# Patient Record
Sex: Male | Born: 1990 | Race: White | Hispanic: No | Marital: Married | Smoking: Never smoker
Health system: Southern US, Community
[De-identification: ages and names within clinical notes are randomized; demographics above are authoritative.]

## PROBLEM LIST (undated history)

## (undated) DIAGNOSIS — C73 Malignant neoplasm of thyroid gland: Secondary | ICD-10-CM

## (undated) DIAGNOSIS — F329 Major depressive disorder, single episode, unspecified: Secondary | ICD-10-CM

## (undated) DIAGNOSIS — J302 Other seasonal allergic rhinitis: Secondary | ICD-10-CM

## (undated) DIAGNOSIS — K219 Gastro-esophageal reflux disease without esophagitis: Secondary | ICD-10-CM

## (undated) DIAGNOSIS — E079 Disorder of thyroid, unspecified: Secondary | ICD-10-CM

## (undated) DIAGNOSIS — F32A Depression, unspecified: Secondary | ICD-10-CM

## (undated) DIAGNOSIS — M1990 Unspecified osteoarthritis, unspecified site: Secondary | ICD-10-CM

## (undated) DIAGNOSIS — G4730 Sleep apnea, unspecified: Secondary | ICD-10-CM

## (undated) DIAGNOSIS — E669 Obesity, unspecified: Secondary | ICD-10-CM

## (undated) DIAGNOSIS — J45909 Unspecified asthma, uncomplicated: Secondary | ICD-10-CM

## (undated) HISTORY — DX: Gastro-esophageal reflux disease without esophagitis: K21.9

## (undated) HISTORY — DX: Obesity, unspecified: E66.9

## (undated) HISTORY — DX: Sleep apnea, unspecified: G47.30

## (undated) HISTORY — DX: Unspecified osteoarthritis, unspecified site: M19.90

## (undated) HISTORY — PX: THYROIDECTOMY: SHX17

## (undated) HISTORY — DX: Disorder of thyroid, unspecified: E07.9

## (undated) HISTORY — DX: Unspecified asthma, uncomplicated: J45.909

---
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
## 2011-06-02 ENCOUNTER — Emergency Department: Payer: Self-pay | Admitting: Emergency Medicine

## 2011-11-08 DIAGNOSIS — J4520 Mild intermittent asthma, uncomplicated: Secondary | ICD-10-CM | POA: Insufficient documentation

## 2012-06-30 DIAGNOSIS — E890 Postprocedural hypothyroidism: Secondary | ICD-10-CM | POA: Insufficient documentation

## 2012-06-30 DIAGNOSIS — C73 Malignant neoplasm of thyroid gland: Secondary | ICD-10-CM | POA: Insufficient documentation

## 2013-04-13 ENCOUNTER — Ambulatory Visit: Payer: Self-pay

## 2013-08-30 ENCOUNTER — Ambulatory Visit: Payer: Self-pay | Admitting: Physician Assistant

## 2013-08-30 LAB — RAPID STREP-A WITH REFLX: Micro Text Report: NEGATIVE

## 2013-09-01 LAB — BETA STREP CULTURE(ARMC)

## 2013-09-24 DIAGNOSIS — G4733 Obstructive sleep apnea (adult) (pediatric): Secondary | ICD-10-CM | POA: Insufficient documentation

## 2013-12-14 ENCOUNTER — Ambulatory Visit: Payer: Self-pay

## 2013-12-14 LAB — CBC WITH DIFFERENTIAL/PLATELET
BASOS PCT: 0.8 %
Basophil #: 0.2 10*3/uL — ABNORMAL HIGH (ref 0.0–0.1)
Eosinophil #: 0.5 10*3/uL (ref 0.0–0.7)
Eosinophil %: 3 %
HCT: 42.5 % (ref 40.0–52.0)
HGB: 13.6 g/dL (ref 13.0–18.0)
LYMPHS PCT: 18 %
Lymphocyte #: 3.2 10*3/uL (ref 1.0–3.6)
MCH: 26.9 pg (ref 26.0–34.0)
MCHC: 32 g/dL (ref 32.0–36.0)
MCV: 84 fL (ref 80–100)
MONO ABS: 1.1 x10 3/mm — AB (ref 0.2–1.0)
MONOS PCT: 6 %
NEUTROS ABS: 12.8 10*3/uL — AB (ref 1.4–6.5)
NEUTROS PCT: 72.2 %
Platelet: 293 10*3/uL (ref 150–440)
RBC: 5.05 10*6/uL (ref 4.40–5.90)
RDW: 14.3 % (ref 11.5–14.5)
WBC: 17.8 10*3/uL — ABNORMAL HIGH (ref 3.8–10.6)

## 2013-12-14 LAB — MONONUCLEOSIS SCREEN: MONO TEST: NEGATIVE

## 2013-12-14 LAB — RAPID STREP-A WITH REFLX: MICRO TEXT REPORT: NEGATIVE

## 2013-12-18 LAB — BETA STREP CULTURE(ARMC)

## 2014-01-14 DIAGNOSIS — E559 Vitamin D deficiency, unspecified: Secondary | ICD-10-CM | POA: Insufficient documentation

## 2014-03-18 ENCOUNTER — Ambulatory Visit: Payer: Self-pay | Admitting: Physician Assistant

## 2014-05-02 DIAGNOSIS — Z9884 Bariatric surgery status: Secondary | ICD-10-CM | POA: Insufficient documentation

## 2015-12-17 ENCOUNTER — Encounter: Payer: Self-pay | Admitting: Emergency Medicine

## 2015-12-17 ENCOUNTER — Ambulatory Visit
Admission: EM | Admit: 2015-12-17 | Discharge: 2015-12-17 | Disposition: A | Discharge status: Home / Self Care | Payer: BLUE CROSS/BLUE SHIELD | Attending: Family Medicine | Admitting: Family Medicine

## 2015-12-17 DIAGNOSIS — K529 Noninfective gastroenteritis and colitis, unspecified: Secondary | ICD-10-CM | POA: Diagnosis not present

## 2015-12-17 HISTORY — DX: Other seasonal allergic rhinitis: J30.2

## 2015-12-17 HISTORY — DX: Malignant neoplasm of thyroid gland: C73

## 2015-12-17 LAB — RAPID INFLUENZA A&B ANTIGENS (ARMC ONLY)
INFLUENZA A (ARMC): NEGATIVE
INFLUENZA B (ARMC): NEGATIVE

## 2015-12-17 MED ORDER — RANITIDINE HCL 150 MG PO CAPS: ORAL_CAPSULE | ORAL | Status: DC

## 2015-12-17 MED ORDER — BISMUTH SUBSALICYLATE 262 MG/15ML PO SUSP: 30.0000 mL | Freq: Three times a day (TID) | ORAL | Status: DC

## 2015-12-17 MED ORDER — ONDANSETRON 8 MG PO TBDP: 8.0000 mg | ORAL_TABLET | Freq: Three times a day (TID) | ORAL | Status: DC | PRN

## 2015-12-17 NOTE — ED Provider Notes (Signed)
CSN: [id number]     Arrival date & time 12/17/15  1428 History   First MD Initiated Contact with Patient 12/17/15 1508     Nurses notes were reviewed. Chief Complaint  Patient presents with  . Emesis  . Diarrhea  . Generalized Body Aches   Patient reports Wednesday to me nauseous sick on stomach and having diarrhea. He thought he took some Imodium on Friday the diarrhea stopped but he did have more nausea vomiting last night. Overall though the muscle aches and muscle pain has gotten better he feels better but he had the nausea and vomiting. States his sister got sick on Sunday they did eat [place] food together but he didn't a symptoms until Wednesday which would go against this being reports this is a also a completely different foods than the sister ate.    (Consider location/radiation/quality/duration/timing/severity/associated sxs/prior Treatment) Patient is a 25 y.o. male presenting with vomiting and diarrhea. The history is provided by the patient. No language interpreter was used.  Emesis Severity:  Moderate Able to tolerate:  Liquids and solids Progression:  Improving Chronicity:  New Recent urination:  Normal Associated symptoms: diarrhea and myalgias   Risk factors: no alcohol use, no diabetes, not pregnant now, no prior abdominal surgery, no sick contacts, no suspect food intake and no travel to endemic areas   Diarrhea Associated symptoms: myalgias and vomiting     Past Medical History  Diagnosis Date  . Seasonal allergies   . Thyroid cancer [facility])    Past Surgical History  Procedure Laterality Date  . Thyroidectomy     History reviewed. No pertinent family history. Social History  Substance Use Topics  . Smoking status: Never Smoker   . Smokeless tobacco: None  . Alcohol Use: No    Review of Systems  Gastrointestinal: Positive for nausea, vomiting and diarrhea.  Musculoskeletal: Positive for myalgias.  All other systems reviewed and are  negative.   Allergies  Review of patient's allergies indicates no known allergies.  Home Medications   Prior to Admission medications   Medication Sig Start Date End Date Taking? Authorizing Provider  levothyroxine (SYNTHROID, LEVOTHROID) 200 MCG tablet Take 200 mcg by mouth daily before breakfast.   Yes Historical Provider, MD  montelukast (SINGULAIR) 10 MG tablet Take 10 mg by mouth at bedtime.   Yes Historical Provider, MD  bismuth subsalicylate (PEPTO-BISMOL) 262 MG/15ML suspension Take 30 mLs by mouth 3 (three) times daily. Take with Zantac 3 times a day for 3-5 days may use tablet equivalency 12/17/15   Frederich Cha, MD  ondansetron (ZOFRAN ODT) 8 MG disintegrating tablet Take 1 tablet (8 mg total) by mouth every 8 (eight) hours as needed for nausea or vomiting. 12/17/15   Frederich Cha, MD  ranitidine (ZANTAC) 150 MG capsule 1 capsule 3 times a day with Pepto-Bismol dosage for 3-5 days 12/17/15   Frederich Cha, MD   Meds Ordered and Administered this Visit  Medications - No data to display  BP 96/60 mmHg  Pulse 79  Temp(Src) 98.8 F (37.1 C) (Oral)  Resp 17  Ht 5\' 8"  (1.727 m)  Wt 260 lb (117.935 kg)  BMI 39.54 kg/m2  SpO2 99% No data found.   Physical Exam  Constitutional: He is oriented to person, place, and time. He appears well-developed and well-nourished.  HENT:  Head: Normocephalic and atraumatic.  Eyes: Conjunctivae are normal. Pupils are equal, round, and reactive to light.  Neck: Normal range of motion. Neck supple. No tracheal deviation present.  Cardiovascular: Normal rate, regular rhythm and normal heart sounds.   Pulmonary/Chest: Breath sounds normal. No respiratory distress. He has no wheezes.  Abdominal: Soft. There is no splenomegaly or hepatomegaly. There is tenderness in the left lower quadrant. There is no CVA tenderness. No hernia. Hernia confirmed negative in the ventral area.    Musculoskeletal: Normal range of motion. He exhibits no tenderness.   Neurological: He is alert and oriented to person, place, and time.  Skin: Skin is warm and dry.  Psychiatric: He has a normal mood and affect.  Vitals reviewed.       ED Course  Procedures (including critical care time)  Labs Review Labs Reviewed  RAPID INFLUENZA A&B ANTIGENS ([place])    Imaging Review No results found.   Visual Acuity Review  Right Eye Distance:   Left Eye Distance:   Bilateral Distance:    Right Eye Near:   Left Eye Near:    Bilateral Near:     Results for orders placed or performed during the hospital encounter of 12/17/15  Rapid Influenza A&B Antigens ([facility] only)  Result Value Ref Range   Influenza A ([facility]) NEGATIVE NEGATIVE   Influenza B ([facility]) NEGATIVE NEGATIVE      MDM   1. Gastroenteritis, acute    Should be noted that respiratory some test was negative patient apparently had stomach virus having sister also being sick as well on different days. We'll place him on Zantac and Pepto-Bismol 3 times a day for the next 3-5 days. He can continue using Imodium which she is using over-the-counter to be careful since we don't want diarrhea or bowel movements to stop completely. Also recommend Zofran 8 mg sublingual when necessary basis for nausea and vomiting as needed. Will not return to work on Monday but will give a note that he was seen today as. He did miss work yesterday.  Note: This dictation was prepared with Dragon dictation along with smaller phrase technology. Any transcriptional errors that result from this process are unintentional.  Frederich Cha, MD 12/17/15 [phone]

## 2015-12-17 NOTE — ED Notes (Addendum)
Patient c/o diarrhea, vomiting, bodyaches, and headaches that started on Wed.  Patient states that he is feeling better.

## 2015-12-17 NOTE — Discharge Instructions (Signed)
Norovirus Infection °A norovirus infection is caused by exposure to a virus in a group of similar viruses (noroviruses). This type of infection causes inflammation in your stomach and intestines (gastroenteritis). Norovirus is the most common cause of gastroenteritis. It also causes food poisoning. °Anyone can get a norovirus infection. It spreads very easily (contagious). You can get it from contaminated food, water, surfaces, or other people. Norovirus is found in the stool or vomit of infected people. You can spread the infection as soon as you feel sick until 2 weeks after you recover.  °Symptoms usually begin within 2 days after you become infected. Most norovirus symptoms affect the digestive system. °CAUSES °Norovirus infection is caused by contact with norovirus. You can catch norovirus if you: °· Eat or drink something contaminated with norovirus. °· Touch surfaces or objects contaminated with norovirus and then put your hand in your mouth. °· Have direct contact with an infected person who has symptoms. °· Share food, drink, or utensils with someone with who is sick with norovirus. °SIGNS AND SYMPTOMS °Symptoms of norovirus may include: °· Nausea. °· Vomiting. °· Diarrhea. °· Stomach cramps. °· Fever. °· Chills. °· Headache. °· Muscle aches. °· Tiredness. °DIAGNOSIS °Your health care provider may suspect norovirus based on your symptoms and physical exam. Your health care provider may also test a sample of your stool or vomit for the virus.  °TREATMENT °There is no specific treatment for norovirus. Most people get better without treatment in about 2 days. °HOME CARE INSTRUCTIONS °· Replace lost fluids by drinking plenty of water or rehydration fluids containing important minerals called electrolytes. This prevents dehydration. Drink enough fluid to keep your urine clear or pale yellow. °· Do not prepare food for others while you are infected. Wait at least 3 days after recovering from the illness to do  that. °PREVENTION  °· Wash your hands often, especially after using the toilet or changing a diaper. °· Wash fruits and vegetables thoroughly before preparing or serving them. °· Throw out any food that a sick person may have touched. °· Disinfect contaminated surfaces immediately after someone in the household has been sick. Use a bleach-based household cleaner. °· Immediately remove and wash soiled clothes or sheets. °SEEK MEDICAL CARE IF: °· Your vomiting, diarrhea, and stomach pain is getting worse. °· Your symptoms of norovirus do not go away after 2-3 days. °SEEK IMMEDIATE MEDICAL CARE IF:  °You develop symptoms of dehydration that do not improve with fluid replacement. This may include: °· Excessive sleepiness. °· Lack of tears. °· Dry mouth. °· Dizziness when standing. °· Weak pulse. °  °This information is not intended to replace advice given to you by your health care provider. Make sure you discuss any questions you have with your health care provider. °  °Document Released: 11/24/2002 Document Revised: 09/24/2014 Document Reviewed: 02/11/2014 °Elsevier Interactive Patient Education ©2016 [employer] ° °

## 2017-03-30 ENCOUNTER — Encounter (HOSPITAL_COMMUNITY): Payer: Self-pay | Admitting: Emergency Medicine

## 2017-03-30 ENCOUNTER — Emergency Department (HOSPITAL_BASED_OUTPATIENT_CLINIC_OR_DEPARTMENT_OTHER)
Admission: EM | Admit: 2017-03-30 | Discharge: 2017-03-30 | Disposition: A | Discharge status: Home / Self Care | Payer: 59 | Source: Home / Self Care | Attending: Emergency Medicine | Admitting: Emergency Medicine

## 2017-03-30 ENCOUNTER — Emergency Department (HOSPITAL_COMMUNITY)
Admission: EM | Admit: 2017-03-30 | Discharge: 2017-03-30 | Disposition: A | Discharge status: Home / Self Care | Payer: 59 | Attending: Emergency Medicine | Admitting: Emergency Medicine

## 2017-03-30 DIAGNOSIS — M7989 Other specified soft tissue disorders: Secondary | ICD-10-CM | POA: Diagnosis not present

## 2017-03-30 DIAGNOSIS — Y9389 Activity, other specified: Secondary | ICD-10-CM | POA: Insufficient documentation

## 2017-03-30 DIAGNOSIS — Y9289 Other specified places as the place of occurrence of the external cause: Secondary | ICD-10-CM | POA: Diagnosis not present

## 2017-03-30 DIAGNOSIS — C73 Malignant neoplasm of thyroid gland: Secondary | ICD-10-CM | POA: Insufficient documentation

## 2017-03-30 DIAGNOSIS — W1789XA Other fall from one level to another, initial encounter: Secondary | ICD-10-CM | POA: Diagnosis not present

## 2017-03-30 DIAGNOSIS — Y999 Unspecified external cause status: Secondary | ICD-10-CM | POA: Diagnosis not present

## 2017-03-30 DIAGNOSIS — R200 Anesthesia of skin: Secondary | ICD-10-CM | POA: Insufficient documentation

## 2017-03-30 DIAGNOSIS — S79922A Unspecified injury of left thigh, initial encounter: Secondary | ICD-10-CM | POA: Diagnosis present

## 2017-03-30 DIAGNOSIS — S8012XA Contusion of left lower leg, initial encounter: Secondary | ICD-10-CM | POA: Diagnosis not present

## 2017-03-30 DIAGNOSIS — Z79899 Other long term (current) drug therapy: Secondary | ICD-10-CM | POA: Insufficient documentation
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
## 2017-03-30 NOTE — Discharge Instructions (Signed)
You do not have a blood clot in your left leg. Elevate leg, ice pack, Ace wrap, minimize activity.  Your leg may be swollen and discolored for a long period time.

## 2017-03-30 NOTE — Progress Notes (Signed)
**  Preliminary report by tech**  Left lower extremity venous duplex complete. There is no evidence of deep or superficial vein thrombosis involving the left lower extremity. All visualized vessels appear patent and compressible. There is no evidence of a Baker's cyst on the left. Results were given to Dr. Lacinda Axon.  03/30/17 11:27 AM Tony Pham RVT

## 2017-10-28 ENCOUNTER — Encounter: Payer: Self-pay | Admitting: Emergency Medicine

## 2017-10-28 ENCOUNTER — Other Ambulatory Visit: Payer: Self-pay

## 2017-10-28 ENCOUNTER — Emergency Department
Admission: EM | Admit: 2017-10-28 | Discharge: 2017-10-28 | Disposition: A | Discharge status: Home / Self Care | Payer: 59 | Source: Home / Self Care | Attending: Family Medicine | Admitting: Family Medicine

## 2017-10-28 DIAGNOSIS — R21 Rash and other nonspecific skin eruption: Secondary | ICD-10-CM | POA: Diagnosis not present

## 2017-10-28 MED ORDER — KETOCONAZOLE 2 % EX CREA: 1.0000 "application " | TOPICAL_CREAM | Freq: Every day | CUTANEOUS | 1 refills | Status: DC

## 2017-10-28 MED ORDER — TRIAMCINOLONE ACETONIDE 0.1 % EX CREA: 1.0000 "application " | TOPICAL_CREAM | Freq: Two times a day (BID) | CUTANEOUS | 1 refills | Status: DC

## 2017-10-28 NOTE — ED Provider Notes (Signed)
[facility]    CSN: [id number] Arrival date & time: 10/28/17  1537     History   Chief Complaint Chief Complaint  Patient presents with  . Rash    HPI Tony Pham is a 27 y.o. male.   Patient complains of a pruritic rash on his left arm for 1.5 weeks.  He wonders if it is a fungal infection, although it has not responded to clotrimazole cream   The history is provided by the patient.  Rash  Location: left arm. Quality: dryness, itchiness and redness   Quality: not blistering, not bruising, not burning, not draining, not painful, not peeling, not scaling, not swelling and not weeping   Severity:  Mild Onset quality:  Gradual Timing:  Constant Progression:  Worsening Chronicity:  New Context: not animal contact, not chemical exposure, not exposure to similar rash, not food, not hot tub use, not insect bite/sting, not medications, not new detergent/soap, not nuts and not plant contact   Relieved by:  Nothing Worsened by:  Nothing Ineffective treatments: clotrimazole cream. Associated symptoms: no diarrhea, no fatigue, no fever, no induration, no joint pain, no myalgias, no nausea and no sore throat     Past Medical History:  Diagnosis Date  . Seasonal allergies   . Thyroid cancer ([place])     There are no active problems to display for this patient.   Past Surgical History:  Procedure Laterality Date  . THYROIDECTOMY         Home Medications    Prior to Admission medications   Medication Sig Start Date End Date Taking? Authorizing Provider  fluticasone (FLONASE) 50 MCG/ACT nasal spray Place 1 spray into both nostrils daily.    [provider]  ketoconazole (NIZORAL) 2 % cream Apply 1 application topically daily. Use for two weeks. 10/28/17   Kandra Nicolas, MD  levothyroxine (SYNTHROID, LEVOTHROID) 112 MCG tablet Take 224 mcg by mouth daily before breakfast.     [provider]  loratadine (CLARITIN) 10 MG tablet Take 10 mg  by mouth daily.    [provider]  montelukast (SINGULAIR) 10 MG tablet Take 10 mg by mouth at bedtime.    [provider]  Multiple Vitamin (MULTIVITAMIN) capsule Take 1 capsule by mouth daily.    [provider]  triamcinolone cream (KENALOG) 0.1 % Apply 1 application topically 2 (two) times daily. 10/28/17   Kandra Nicolas, MD  Vitamin D, Ergocalciferol, (DRISDOL) 50000 units CAPS capsule Take 50,000 Units by mouth every 7 (seven) days.    [provider]    Family History No family history on file.  Social History Social History   Tobacco Use  . Smoking status: Never Smoker  . Smokeless tobacco: Never Used  Substance Use Topics  . Alcohol use: No  . Drug use: No     Allergies   Patient has no known allergies.   Review of Systems Review of Systems  Constitutional: Negative for fatigue and fever.  HENT: Negative for sore throat.   Gastrointestinal: Negative for diarrhea and nausea.  Musculoskeletal: Negative for arthralgias and myalgias.  Skin: Positive for rash.  All other systems reviewed and are negative.    Physical Exam Triage Vital Signs ED Triage Vitals [10/28/17 1618]  Enc Vitals Group     BP 102/67     Pulse Rate 67     Resp      Temp 98.3 F (36.8 C)     Temp Source Oral  SpO2 97 %     Weight (!) 303 lb (137.4 kg)     Height 5\' 8"  (1.727 m)     Head Circumference      Peak Flow      Pain Score      Pain Loc      Pain Edu?      Excl. in [place]?    No data found.  Updated Vital Signs BP 102/67 (BP Location: Right Arm)   Pulse 67   Temp 98.3 F (36.8 C) (Oral)   Ht 5\' 8"  (1.727 m)   Wt (!) 303 lb (137.4 kg)   SpO2 97%   BMI 46.07 kg/m   Visual Acuity Right Eye Distance:   Left Eye Distance:   Bilateral Distance:    Right Eye Near:   Left Eye Near:    Bilateral Near:     Physical Exam  Constitutional: He appears well-developed and well-nourished. No distress.  HENT:  Head: Normocephalic.    Right Ear: External ear normal.  Left Ear: External ear normal.  Nose: Nose normal.  Mouth/Throat: Oropharynx is clear and moist.  Eyes: Pupils are equal, round, and reactive to light.  Neck: Neck supple.  Cardiovascular: Normal heart sounds.  Pulmonary/Chest: Breath sounds normal.  Musculoskeletal: He exhibits no edema.       Right shoulder: He exhibits no tenderness and no swelling.       Arms: Left arm has scattered erythematous maculopapular lesions 5 to 72mm diameter.  Lymphadenopathy:    He has no cervical adenopathy.  Neurological: He is alert.  Skin: Skin is warm and dry.  Nursing note and vitals reviewed.    UC Treatments / Results  Labs (all labs ordered are listed, but only abnormal results are displayed) Labs Reviewed - No data to display  EKG  EKG Interpretation None       Radiology No results found.  Procedures Procedures (including critical care time)  Medications Ordered in UC Medications - No data to display   Initial Impression / Assessment and Plan / UC Course  I have reviewed the triage vital signs and the nursing notes.  Pertinent labs & imaging results that were available during my care of the patient were reviewed by me and considered in my medical decision making (see chart for details).    ?fungal etiology Begin empiric Nizoral 2% cream and triamcinolone 0.1% cream Followup with dermatologist if not improving two weeks.    Final Clinical Impressions(s) / UC Diagnoses   Final diagnoses:  Rash and nonspecific skin eruption    ED Discharge Orders        Ordered    ketoconazole (NIZORAL) 2 % cream  Daily     10/28/17 1649    triamcinolone cream (KENALOG) 0.1 %  2 times daily     10/28/17 1649           Kandra Nicolas, MD 11/04/17 1916

## 2017-10-28 NOTE — ED Triage Notes (Addendum)
Rash on left arm x 1.5 weeks. Has been using Lotrimin, but it is getting worse

## 2018-02-26 ENCOUNTER — Other Ambulatory Visit: Payer: Self-pay

## 2018-02-26 ENCOUNTER — Encounter: Payer: Self-pay | Admitting: Emergency Medicine

## 2018-02-26 ENCOUNTER — Emergency Department
Admission: EM | Admit: 2018-02-26 | Discharge: 2018-02-26 | Disposition: A | Discharge status: Home / Self Care | Payer: 59 | Source: Home / Self Care | Attending: Family Medicine | Admitting: Family Medicine

## 2018-02-26 DIAGNOSIS — H6505 Acute serous otitis media, recurrent, left ear: Secondary | ICD-10-CM

## 2018-02-26 DIAGNOSIS — B9789 Other viral agents as the cause of diseases classified elsewhere: Secondary | ICD-10-CM

## 2018-02-26 DIAGNOSIS — J029 Acute pharyngitis, unspecified: Secondary | ICD-10-CM

## 2018-02-26 DIAGNOSIS — J069 Acute upper respiratory infection, unspecified: Secondary | ICD-10-CM

## 2018-02-26 LAB — POCT RAPID STREP A (OFFICE): RAPID STREP A SCREEN: NEGATIVE

## 2018-02-26 MED ORDER — PREDNISONE 20 MG PO TABS: ORAL_TABLET | ORAL | 0 refills | Status: DC

## 2018-02-26 MED ORDER — AMOXICILLIN 875 MG PO TABS: 875.0000 mg | ORAL_TABLET | Freq: Two times a day (BID) | ORAL | 0 refills | Status: DC

## 2018-02-26 MED ORDER — BENZONATATE 200 MG PO CAPS: ORAL_CAPSULE | ORAL | 0 refills | Status: DC

## 2018-02-26 NOTE — ED Provider Notes (Signed)
[facility]    CSN: [id number] Arrival date & time: 02/26/18  0818     History   Chief Complaint Chief Complaint  Patient presents with  . Sore Throat  . Nasal Congestion    HPI Tony Pham is a 27 y.o. male.   Patient complains of four day history of typical cold-like symptoms developing over several days, including mild sore throat, sinus congestion, fatigue, chills, and cough.  His ears now feel full.  He has a past history of frequent otitis media as a child, and ventilation tubes.  He has a past history of pneumonia about 15 years ago.  He has seasonal rhinitis (springtime), and seasonally induced asthma.  The history is provided by the patient.    Past Medical History:  Diagnosis Date  . Seasonal allergies   . Thyroid cancer ([place])     There are no active problems to display for this patient.   Past Surgical History:  Procedure Laterality Date  . THYROIDECTOMY         Home Medications    Prior to Admission medications   Medication Sig Start Date End Date Taking? Authorizing Provider  amoxicillin (AMOXIL) 875 MG tablet Take 1 tablet (875 mg total) by mouth 2 (two) times daily. 02/26/18   Kandra Nicolas, MD  benzonatate (TESSALON) 200 MG capsule Take one cap by mouth at bedtime as needed for cough.  May repeat in 4 to 6 hours 02/26/18   Kandra Nicolas, MD  fluticasone [facility]) 50 MCG/ACT nasal spray Place 1 spray into both nostrils daily.    [provider]  ketoconazole (NIZORAL) 2 % cream Apply 1 application topically daily. Use for two weeks. 10/28/17   Kandra Nicolas, MD  levothyroxine (SYNTHROID, LEVOTHROID) 112 MCG tablet Take 224 mcg by mouth daily before breakfast.     [provider]  loratadine (CLARITIN) 10 MG tablet Take 10 mg by mouth daily.    [provider]  montelukast (SINGULAIR) 10 MG tablet Take 10 mg by mouth at bedtime.    [provider]  Multiple Vitamin (MULTIVITAMIN) capsule Take  1 capsule by mouth daily.    [provider]  predniSONE (DELTASONE) 20 MG tablet Take one tab by mouth twice daily for 5 days, then one daily for 3 days. Take with food. 02/26/18   Kandra Nicolas, MD  triamcinolone cream (KENALOG) 0.1 % Apply 1 application topically 2 (two) times daily. 10/28/17   Kandra Nicolas, MD  Vitamin D, Ergocalciferol, (DRISDOL) 50000 units CAPS capsule Take 50,000 Units by mouth every 7 (seven) days.    [provider]    Family History History reviewed. No pertinent family history.  Social History Social History   Tobacco Use  . Smoking status: Never Smoker  . Smokeless tobacco: Never Used  Substance Use Topics  . Alcohol use: No  . Drug use: No     Allergies   Patient has no known allergies.   Review of Systems Review of Systems + sore throat + cough No pleuritic pain No wheezing + nasal congestion + post-nasal drainage No sinus pain/pressure No itchy/red eyes ? earache No hemoptysis No SOB No fever, + chills No nausea No vomiting No abdominal pain No diarrhea No urinary symptoms No skin rash + fatigue No myalgias No headache Used OTC meds without relief  Physical Exam Triage Vital Signs ED Triage Vitals  Enc Vitals Group     BP 02/26/18 0836 130/80  Pulse Rate 02/26/18 0836 87     Resp --      Temp 02/26/18 0836 98.3 F (36.8 C)     Temp Source 02/26/18 0836 Oral     SpO2 02/26/18 0836 99 %     Weight 02/26/18 0837 (!) 303 lb (137.4 kg)     Height 02/26/18 0837 5\' 9"  (1.753 m)     Head Circumference --      Peak Flow --      Pain Score 02/26/18 0837 6     Pain Loc --      Pain Edu? --      Excl. in [place]? --    No data found.  Updated Vital Signs BP 130/80 (BP Location: Right Arm)   Pulse 87   Temp 98.3 F (36.8 C) (Oral)   Ht 5\' 9"  (1.753 m)   Wt (!) 303 lb (137.4 kg)   SpO2 99%   BMI 44.75 kg/m   Visual Acuity Right Eye Distance:   Left Eye Distance:   Bilateral Distance:     Right Eye Near:   Left Eye Near:    Bilateral Near:     Physical Exam Nursing notes and Vital Signs reviewed. Appearance:  Patient appears stated age, and in no acute distress Eyes:  Pupils are equal, round, and reactive to light and accomodation.  Extraocular movement is intact.  Conjunctivae are not inflamed  Ears:  Canals normal.  Right tympanic membrane scarred.  Left tympanic membrane scarred, slightly erythematous, and serous effusion present. Nose:  Congested turbinates.  No sinus tenderness.    Pharynx:   Mild erythema uvula. Neck:  Supple.  Enlarged posterior/lateral nodes are palpated bilaterally, tender to palpation on the left.   Lungs:  Clear to auscultation.  Breath sounds are equal.  Moving air well. Heart:  Regular rate and rhythm without murmurs, rubs, or gallops.  Abdomen:  Nontender without masses or hepatosplenomegaly.  Bowel sounds are present.  No CVA or flank tenderness.  Extremities:  No edema.  Skin:  No rash present.    UC Treatments / Results  Labs (all labs ordered are listed, but only abnormal results are displayed) Labs Reviewed  STREP A DNA PROBE  POCT RAPID STREP A (OFFICE) negative    EKG None  Radiology No results found.  Procedures Procedures (including critical care time)  Medications Ordered in UC Medications - No data to display  Initial Impression / Assessment and Plan / UC Course  I have reviewed the triage vital signs and the nursing notes.  Pertinent labs & imaging results that were available during my care of the patient were reviewed by me and considered in my medical decision making (see chart for details).    Begin amoxicillin, and prednisone burst/taper. Prescription written for Benzonatate [facility]) to take at bedtime for night-time cough. Followup with Family Doctor if not improved in about 10 days.  Final Clinical Impressions(s) / UC Diagnoses   Final diagnoses:  Acute pharyngitis, unspecified etiology  Viral  URI with cough  Recurrent acute serous otitis media of left ear     Discharge Instructions     Take plain guaifenesin (1200mg  extended release tabs such as Mucinex) twice daily, with plenty of water, for cough and congestion.  May add Pseudoephedrine (30mg , one or two every 4 to 6 hours) for sinus congestion.  Get adequate rest.   May use Afrin nasal spray (or generic oxymetazoline) each morning for about 5 days and then discontinue.  Also recommend using saline nasal spray several times daily and saline nasal irrigation (AYR is a common brand)  Try warm salt water gargles for sore throat.  Stop all antihistamines for now (such as Claritin) and other non-prescription cough/cold preparations. Continue Singulair.        ED Prescriptions    Medication Sig Dispense Auth. Provider   amoxicillin (AMOXIL) 875 MG tablet  (Status: Discontinued) Take 1 tablet (875 mg total) by mouth 2 (two) times daily. 20 tablet Kandra Nicolas, MD   predniSONE (DELTASONE) 20 MG tablet  (Status: Discontinued) Take one tab by mouth twice daily for 5 days, then one daily for 3 days. Take with food. 13 tablet Kandra Nicolas, MD   benzonatate (TESSALON) 200 MG capsule Take one cap by mouth at bedtime as needed for cough.  May repeat in 4 to 6 hours 15 capsule Kandra Nicolas, MD   predniSONE (DELTASONE) 20 MG tablet Take one tab by mouth twice daily for 5 days, then one daily for 3 days. Take with food. 13 tablet Kandra Nicolas, MD   amoxicillin (AMOXIL) 875 MG tablet Take 1 tablet (875 mg total) by mouth 2 (two) times daily. 20 tablet Kandra Nicolas, MD         Kandra Nicolas, MD 02/26/18 [phone]

## 2018-02-26 NOTE — Discharge Instructions (Addendum)
Take plain guaifenesin (1200mg  extended release tabs such as Mucinex) twice daily, with plenty of water, for cough and congestion.  May add Pseudoephedrine (30mg , one or two every 4 to 6 hours) for sinus congestion.  Get adequate rest.   May use Afrin nasal spray (or generic oxymetazoline) each morning for about 5 days and then discontinue.  Also recommend using saline nasal spray several times daily and saline nasal irrigation (AYR is a common brand)  Try warm salt water gargles for sore throat.  Stop all antihistamines for now (such as Claritin) and other non-prescription cough/cold preparations. Continue Singulair.

## 2018-02-26 NOTE — ED Triage Notes (Signed)
27 y.o male presents today with sore throat, right ear pain and congestion x4 days. He states he's been taking OTC meds with no relief.

## 2018-02-27 ENCOUNTER — Telehealth: Payer: Self-pay

## 2018-02-27 LAB — STREP A DNA PROBE: GROUP A STREP PROBE: NOT DETECTED

## 2018-02-27 NOTE — Telephone Encounter (Signed)
Spoke with patient, feeling better.  Lab results given.  Will follow up as needed.

## 2019-07-31 ENCOUNTER — Emergency Department (HOSPITAL_COMMUNITY): Payer: No Typology Code available for payment source

## 2019-07-31 ENCOUNTER — Encounter (HOSPITAL_COMMUNITY): Payer: Self-pay | Admitting: Emergency Medicine

## 2019-07-31 ENCOUNTER — Emergency Department (HOSPITAL_COMMUNITY)
Admission: EM | Admit: 2019-07-31 | Discharge: 2019-07-31 | Disposition: A | Discharge status: Home / Self Care | Payer: No Typology Code available for payment source | Attending: Emergency Medicine | Admitting: Emergency Medicine

## 2019-07-31 DIAGNOSIS — W19XXXA Unspecified fall, initial encounter: Secondary | ICD-10-CM | POA: Insufficient documentation

## 2019-07-31 DIAGNOSIS — S63617A Unspecified sprain of left little finger, initial encounter: Secondary | ICD-10-CM | POA: Diagnosis not present

## 2019-07-31 DIAGNOSIS — Y9389 Activity, other specified: Secondary | ICD-10-CM | POA: Diagnosis not present

## 2019-07-31 DIAGNOSIS — Y92149 Unspecified place in prison as the place of occurrence of the external cause: Secondary | ICD-10-CM | POA: Diagnosis not present

## 2019-07-31 DIAGNOSIS — S8391XA Sprain of unspecified site of right knee, initial encounter: Secondary | ICD-10-CM | POA: Insufficient documentation

## 2019-07-31 DIAGNOSIS — Y998 Other external cause status: Secondary | ICD-10-CM | POA: Diagnosis not present

## 2019-07-31 DIAGNOSIS — Z79899 Other long term (current) drug therapy: Secondary | ICD-10-CM | POA: Insufficient documentation

## 2019-07-31 DIAGNOSIS — S8991XA Unspecified injury of right lower leg, initial encounter: Secondary | ICD-10-CM | POA: Diagnosis present

## 2019-07-31 DIAGNOSIS — Z85850 Personal history of malignant neoplasm of thyroid: Secondary | ICD-10-CM | POA: Insufficient documentation

## 2019-07-31 MED ORDER — ACETAMINOPHEN 325 MG PO TABS
650.0000 mg | ORAL_TABLET | Freq: Once | ORAL | Status: AC
  Administered 2019-07-31: 650 mg via ORAL
  Filled 2019-07-31: qty 2

## 2019-07-31 MED ORDER — IBUPROFEN 800 MG PO TABS
800.0000 mg | ORAL_TABLET | Freq: Once | ORAL | Status: AC
  Administered 2019-07-31: 800 mg via ORAL
  Filled 2019-07-31: qty 1
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
## 2019-07-31 NOTE — Discharge Instructions (Signed)
Take Motrin and Tylenol as needed as directed. Use crutches, weight bear as tolerated. Apply ice to finger and knee for 30 minutes at a time and elevate, at least 3 times daily. Recheck with employee health/workers comp provider in 2 days.

## 2019-08-25 DIAGNOSIS — Z9884 Bariatric surgery status: Secondary | ICD-10-CM | POA: Insufficient documentation

## 2019-12-01 ENCOUNTER — Ambulatory Visit (INDEPENDENT_AMBULATORY_CARE_PROVIDER_SITE_OTHER): Payer: 59

## 2019-12-01 ENCOUNTER — Encounter (HOSPITAL_COMMUNITY): Payer: Self-pay

## 2019-12-01 ENCOUNTER — Other Ambulatory Visit: Payer: Self-pay

## 2019-12-01 ENCOUNTER — Ambulatory Visit (HOSPITAL_COMMUNITY)
Admission: EM | Admit: 2019-12-01 | Discharge: 2019-12-01 | Disposition: A | Discharge status: Home / Self Care | Payer: 59 | Attending: Emergency Medicine | Admitting: Emergency Medicine

## 2019-12-01 DIAGNOSIS — M79672 Pain in left foot: Secondary | ICD-10-CM

## 2019-12-01 HISTORY — DX: Depression, unspecified: F32.A

## 2019-12-01 MED ORDER — IBUPROFEN 600 MG PO TABS: 600.0000 mg | ORAL_TABLET | Freq: Four times a day (QID) | ORAL | 0 refills | Status: DC | PRN

## 2019-12-01 NOTE — ED Triage Notes (Signed)
Pt states he took the brace off his right knee for a torn ACL per dr orders last week and feels like his left foot has been compensating for the right leg and for the past 2 days has been having left foot pain. The pain is 10/10 pain on the outer foot that radiates to 4th and 5th metatarsal when walking on it. Pt has 2+ left pedal pulse, cap refill less than 3 sec, pt was able to walk on it to exam room. Foot warm to touch.

## 2019-12-01 NOTE — Discharge Instructions (Addendum)
Your x-ray was negative for fracture.  You may still have a stress fracture that did not show up.  You will need advanced imaging such as CT, MRI or ultrasound to confirm this.  Wear the cam walker and try staying off of your foot is much as possible by using crutches for the next several weeks.  600 mg of ibuprofen combined with the 1000 mg of Tylenol together 3-4 times a day as needed for pain.  Continue ice for 20 minutes at a time.

## 2019-12-01 NOTE — ED Provider Notes (Signed)
HPI  SUBJECTIVE:  Tony Pham is a 29 y.o. male who presents with left lateral dorsal foot pain for the past 2 days.  It is constant,  becomes sharp with walking.  States that his ankle is fine.  He states that he injured his right PCL/meniscus and has been compensating with his left lower extremity for the past 5 months.  Removed the brace last week per MDs orders in has been compensating more since then.  He denies trauma numbness tingling color changes bruising.  No recent change in shoes.  No erythema increased temperature.  He states it feels swollen.  He has tried potassium pills Tylenol aspirin rest and ice without improvement in symptoms. symptoms are worse with walking.  He has had symptoms like this before in his right foot which resolved with potassium pills.  He has a past medical history of thyroid cancer.  No history of gout osteoporosis peripheral arterial disease peripheral vascular disease diabetes peripheral neuropathy.  [id number], Rubbie Battiest, MD     Past Medical History:  Diagnosis Date  . Depression   . Seasonal allergies   . Thyroid cancer [facility])     Past Surgical History:  Procedure Laterality Date  . THYROIDECTOMY      History reviewed. No pertinent family history.  Social History   Tobacco Use  . Smoking status: Never Smoker  . Smokeless tobacco: Never Used  Substance Use Topics  . Alcohol use: No  . Drug use: No    No current facility-administered medications for this encounter.  Current Outpatient Medications:  .  albuterol (VENTOLIN HFA) 108 (90 Base) MCG/ACT inhaler, Inhale 2 puffs into the lungs every 6 (six) hours as needed for wheezing or shortness of breath., Disp: , Rfl:  .  buPROPion (WELLBUTRIN XL) 150 MG 24 hr tablet, Take 150 mg by mouth., Disp: , Rfl:  .  fluticasone (FLONASE) 50 MCG/ACT nasal spray, Place 1 spray into both nostrils daily., Disp: , Rfl:  .  ibuprofen (ADVIL) 600 MG tablet, Take 1 tablet (600 mg total) by mouth every 6  (six) hours as needed., Disp: 30 tablet, Rfl: 0 .  levothyroxine (SYNTHROID, LEVOTHROID) 112 MCG tablet, Take 224 mcg by mouth daily before breakfast. , Disp: , Rfl:  .  loratadine (CLARITIN) 10 MG tablet, Take 10 mg by mouth daily., Disp: , Rfl:  .  montelukast (SINGULAIR) 10 MG tablet, Take 10 mg by mouth at bedtime., Disp: , Rfl:  .  Multiple Vitamin (MULTIVITAMIN) capsule, Take 1 capsule by mouth daily., Disp: , Rfl:  .  Vitamin D, Ergocalciferol, (DRISDOL) 50000 units CAPS capsule, Take 2,000 Units by mouth every morning. , Disp: , Rfl:   No Known Allergies   ROS  As noted in HPI.   Physical Exam  BP 133/88 (BP Location: Right Arm)   Pulse 78   Temp 98.3 F (36.8 C) (Oral)   Resp 16   Ht 5\' 9"  (1.753 m)   Wt (!) 147.4 kg   SpO2 97%   BMI 47.99 kg/m   Constitutional: Well developed, well nourished, no acute distress Eyes:  EOMI, conjunctiva normal bilaterally HENT: Normocephalic, atraumatic,mucus membranes moist Respiratory: Normal inspiratory effort Cardiovascular: Normal rate GI: nondistended skin: No rash, skin intact Musculoskeletal Left midfoot NT. Base of fifth metatarsal tender. No bruising. Skin intact. DP 2+. Refill less than 2 seconds. Sensation grossly intact. Patient able to move all toes actively.   No pain with  dorsiflexion / plantarflexion. No Tenderness along the  plantar fascia. Distal fibula NT, Medial malleolus NT,  Deltoid ligament NT, Lateral ligaments NT, Achilles NT. Patient able to bear weight while in department. Neurologic: Alert & oriented x 3, no focal neuro deficits Psychiatric: Speech and behavior appropriate   ED Course   Medications - No data to display  Orders Placed This Encounter  Procedures  . DG Foot Complete Left    Standing Status:   Standing    Number of Occurrences:   1    Order Specific Question:   Reason for Exam (SYMPTOM  OR DIAGNOSIS REQUIRED)    Answer:   Fifth metatarsal tenderness rule out fracture.  Marland Kitchen Apply cam  walker    Standing Status:   Standing    Number of Occurrences:   1    Order Specific Question:   Laterality    Answer:   Left  . Maintain cam walker    Standing Status:   Standing    Number of Occurrences:   1    Order Specific Question:   Laterality    Answer:   Left    No results found for this or any previous visit (from the past 24 hour(s)). DG Foot Complete Left  Result Date: 12/01/2019 CLINICAL DATA:  Acute left foot pain without known injury. EXAM: LEFT FOOT - COMPLETE 3+ VIEW COMPARISON:  None. FINDINGS: There is no evidence of fracture or dislocation. There is no evidence of arthropathy or other focal bone abnormality. Soft tissues are unremarkable. IMPRESSION: Negative. Electronically Signed   By: Marijo Conception M.D.   On: 12/01/2019 09:06    ED Clinical Impression  1. Foot pain, left      ED Assessment/Plan  Checking foot x-ray to rule out stress fracture.  No evidence of neurovascular involvement.  Discussed with patient that a negative x-ray does not necessarily rule out stress fracture.  Discussed that he will need advanced imaging such as CT, MRI or possibly even ultrasound to definitively rule stress fracture out.  Placing in cam walker plus minus crutches.  Will send home with ibuprofen 600 mg combined with 1000 mg of Tylenol 3-4 times a day as needed for pain continue ice.  Follow-up with orthopedics or with [facility] if still symptomatic in a week for further imaging and management.  Reviewed imaging independently.  Normal foot.  See radiology report for full details.  Placing in cam walker.  Patient states he has crutches at home.  Plan as above  Discussed imaging, MDM, treatment plan, and plan for follow-up with patient. . patient agrees with plan.   Meds ordered this encounter  Medications  . ibuprofen (ADVIL) 600 MG tablet    Sig: Take 1 tablet (600 mg total) by mouth every 6 (six) hours as needed.    Dispense:  30 tablet    Refill:  0     *This clinic note was created using [employer]. Therefore, there may be occasional mistakes despite careful proofreading.   ?   Melynda Ripple, MD 12/01/19 [phone]

## 2020-01-07 ENCOUNTER — Other Ambulatory Visit (HOSPITAL_COMMUNITY): Payer: Self-pay | Admitting: Orthopaedic Surgery

## 2020-01-07 ENCOUNTER — Other Ambulatory Visit: Payer: Self-pay | Admitting: Orthopaedic Surgery

## 2020-01-07 DIAGNOSIS — M79672 Pain in left foot: Secondary | ICD-10-CM

## 2020-01-12 ENCOUNTER — Ambulatory Visit (HOSPITAL_COMMUNITY)
Admission: RE | Admit: 2020-01-12 | Discharge: 2020-01-12 | Disposition: A | Discharge status: Home / Self Care | Payer: 59 | Source: Ambulatory Visit | Attending: Orthopaedic Surgery | Admitting: Orthopaedic Surgery

## 2020-01-12 ENCOUNTER — Other Ambulatory Visit: Payer: Self-pay

## 2020-01-12 DIAGNOSIS — M79672 Pain in left foot: Secondary | ICD-10-CM | POA: Diagnosis not present

## 2020-03-12 ENCOUNTER — Other Ambulatory Visit: Payer: Self-pay

## 2020-03-12 ENCOUNTER — Telehealth (INDEPENDENT_AMBULATORY_CARE_PROVIDER_SITE_OTHER): Payer: 59 | Admitting: Psychiatry

## 2020-03-12 ENCOUNTER — Encounter (HOSPITAL_COMMUNITY): Payer: Self-pay | Admitting: Psychiatry

## 2020-03-12 ENCOUNTER — Telehealth (HOSPITAL_COMMUNITY): Payer: Self-pay | Admitting: Psychiatry

## 2020-03-12 VITALS — Wt 350.0 lb

## 2020-03-12 DIAGNOSIS — F330 Major depressive disorder, recurrent, mild: Secondary | ICD-10-CM

## 2020-03-12 DIAGNOSIS — F902 Attention-deficit hyperactivity disorder, combined type: Secondary | ICD-10-CM

## 2020-03-12 DIAGNOSIS — R632 Polyphagia: Secondary | ICD-10-CM | POA: Diagnosis not present

## 2020-03-12 MED ORDER — BUPROPION HCL ER (XL) 300 MG PO TB24: 300.0000 mg | ORAL_TABLET | Freq: Every day | ORAL | 1 refills | Status: DC

## 2020-03-12 MED ORDER — TRAZODONE HCL 50 MG PO TABS: 50.0000 mg | ORAL_TABLET | Freq: Every evening | ORAL | 0 refills | Status: DC | PRN

## 2020-03-12 NOTE — Progress Notes (Signed)
Virtual Visit via Video Note  I connected with Laural Pham on 03/12/20 at  9:00 AM EDT by a video enabled telemedicine application and verified that I am speaking with the correct person using two identifiers.  Location: Patient: Home Provider: Home Office   I discussed the limitations of evaluation and management by telemedicine and the availability of in person appointments. The patient expressed understanding and agreed to proceed.   [facility] Initial Assessment Note  Tony Pham [id number] 29 y.o.  03/12/2020 9:02 AM  Chief Complaint:  I have no motivation.  I am depressed.  History of Present Illness:  Tony Pham is a 29 year old Caucasian, married, employed man who is referred from his PCP Dr. Iona Pham for the management of depression.  Patient reported for past 9 months he has been struggling with depression.  He has no motivation, and energy and feels sad.  He had a history of ADHD and prescribed Ritalin from age 29 to age 29 but then he feel he grew up and does not need it.  He still struggles some time with attention and focus but lately he is more depressed.  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  He admitted lately more irritable, frustrated having poor sleep and racing thoughts.  He admitted losing interest in daily activities.  He worries about finances and his health.  He admitted that his job is very stressful.  Last November he was injured at work when he had to intervene a conflict between the client at work.  He injured his knee and torn his meniscus.  He was out of work for 2 months and now back to the light duty.  He is a still doing physical therapy.  Patient admitted since then he feels more sad because he cannot do workout and he had gained excessive weight.  He is with nutritionist and trying to lose weight.  His PCP recently started Wellbutrin XL 150 mg which he took for 2 months but then he stopped after he felt it did not work.  However he restarted  again when his wife told that he is been more irritable and angry and now is back on 150 mg.  He admitted insomnia, thinking too much about himself.  He denies any hallucination, paranoia, violence, OCD, PTSD or any panic attacks.  He also endorsed binge eating and sometimes impulsive that could be one of the reason he had gained weight.  He lives with his wife who he has been married for 2 years.  He has no children.  His grandfather died 2 months ago due to [place].  His parents lives close by.  He had a good support from his family.  He has a history of gastric sleeve, thyroid cancer, mild asthma and taking medication from Dr. Iona Pham.  He is not in therapy.  He is open to adjust his medication.  Past Psychiatric History: History of ADHD and eating disorder.  Remember taking special classes because of lack of focus attention and hyperactivity.  Took Adderall and then Ritalin by psychiatrist from age 59 to age 51.  No history of suicidal attempt, inpatient, mania, psychosis, PTSD, OCD or drug use.  His PCP Dr. Iona Pham started Wellbutrin XL 4 months ago.  Family History: Sister has schizophrenia and another sister has depression.  Past Medical History:  Diagnosis Date  . Depression   . Seasonal allergies   . Thyroid cancer ([place])      Traumatic brain injury: No history  of traumatic brain injury.  Work History; Patient finished high school.  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  Psychosocial History; Patient born and raised in [place].  He has 2 sister.  His parent lives close by.  He is married for past 2 years and his wife is very supportive.  Patient has no children.  Legal History; Patient denies any legal issues.  History Of Abuse; Denies any history of abuse.  Substance Abuse History; Denies any history of substance abuse history.  Neurologic: Headache: No Seizure: No Paresthesias: No   Outpatient  Encounter Medications as of 03/12/2020  Medication Sig  . albuterol (VENTOLIN HFA) 108 (90 Base) MCG/ACT inhaler Inhale 2 puffs into the lungs every 6 (six) hours as needed for wheezing or shortness of breath.  Marland Kitchen buPROPion (WELLBUTRIN XL) 150 MG 24 hr tablet Take 150 mg by mouth.  . fluticasone (FLONASE) 50 MCG/ACT nasal spray Place 1 spray into both nostrils daily.  Marland Kitchen ibuprofen (ADVIL) 600 MG tablet Take 1 tablet (600 mg total) by mouth every 6 (six) hours as needed.  Marland Kitchen levothyroxine (SYNTHROID, LEVOTHROID) 112 MCG tablet Take 224 mcg by mouth daily before breakfast.   . loratadine (CLARITIN) 10 MG tablet Take 10 mg by mouth daily.  . montelukast (SINGULAIR) 10 MG tablet Take 10 mg by mouth at bedtime.  . Multiple Vitamin (MULTIVITAMIN) capsule Take 1 capsule by mouth daily.  . Vitamin D, Ergocalciferol, (DRISDOL) 50000 units CAPS capsule Take 2,000 Units by mouth every morning.    No facility-administered encounter medications on file as of 03/12/2020.    No results found for this or any previous visit (from the past 2160 hour(s)).    Constitutional:  There were no vitals taken for this visit.   Musculoskeletal: Strength & Muscle Tone: within normal limits Gait & Station: normal Patient leans: N/A  Psychiatric Specialty Exam: Physical Exam  ROS  Weight (!) 350 lb (158.8 kg).There is no height or weight on file to calculate BMI.  General Appearance: Casual and over weight  Eye Contact:  Good  Speech:  Normal Rate  Volume:  Normal  Mood:  Anxious, Depressed and Dysphoric  Affect:  Constricted  Thought Process:  Goal Directed  Orientation:  Full (Time, Place, and Person)  Thought Content:  Rumination  Suicidal Thoughts:  No  Homicidal Thoughts:  No  Memory:  Immediate;   Good Recent;   Good Remote;   Good  Judgement:  Intact  Insight:  Present  Psychomotor Activity:  Normal  Concentration:  Concentration: Fair and Attention Span: Fair  Recall:  Good  Fund of Knowledge:   Good  Language:  Good  Akathisia:  No  Handed:  Right  AIMS (if indicated):     Assets:  Communication Skills Desire for Improvement Housing Resilience Social Support Transportation  ADL's:  Intact  Cognition:  WNL  Sleep:   poor     Assessment/Plan:  Vada is 29 year old with history of ADHD, eating disorder presented with symptoms of depression.  His PCP Dr. Iona Pham prescribed Wellbutrin XL 150 mg but he has not seen much improvement.  He tried to stop the medication but wife noticed that he is more irritable.  I recommend to increase the dose to 300 to see if that helps his depression.  He also concerned about weight gain.  I recommend if increased Wellbutrin did not help him we may consider Trintellix  or Vyvanse.  I also suggest that he should consider his insurance if these medicines covered from insurance.  I would also add low-dose trazodone to help sleep.  I offered therapy but patient at this time does not feel he needed.  I discussed medication side effects and benefits.  Encouraged to continue weight loss program with dietitian.  I provided nurse triage line in case he has any question concern or issues with the medication.  Recommended to call us back if symptoms worsening.  Follow-up in 4 to 6 weeks.  Kathlee Nations, MD 03/12/2020             Follow Up Instructions:    I discussed the assessment and treatment plan with the patient. The patient was provided an opportunity to ask questions and all were answered. The patient agreed with the plan and demonstrated an understanding of the instructions.   The patient was advised to call back or seek an in-person evaluation if the symptoms worsen or if the condition fails to improve as anticipated.  I provided 55 minutes of non-face-to-face time during this encounter.   Kathlee Nations, MD

## 2020-07-18 DIAGNOSIS — M25561 Pain in right knee: Secondary | ICD-10-CM | POA: Insufficient documentation

## 2020-07-18 DIAGNOSIS — S83521A Sprain of posterior cruciate ligament of right knee, initial encounter: Secondary | ICD-10-CM | POA: Insufficient documentation

## 2021-02-27 ENCOUNTER — Other Ambulatory Visit: Payer: Self-pay

## 2021-02-27 ENCOUNTER — Ambulatory Visit
Admission: EM | Admit: 2021-02-27 | Discharge: 2021-02-27 | Disposition: A | Discharge status: Home / Self Care | Payer: 59 | Attending: Emergency Medicine | Admitting: Emergency Medicine

## 2021-02-27 ENCOUNTER — Encounter: Payer: Self-pay | Admitting: Emergency Medicine

## 2021-02-27 DIAGNOSIS — H6693 Otitis media, unspecified, bilateral: Secondary | ICD-10-CM | POA: Insufficient documentation

## 2021-02-27 DIAGNOSIS — J029 Acute pharyngitis, unspecified: Secondary | ICD-10-CM | POA: Insufficient documentation

## 2021-02-27 LAB — POCT RAPID STREP A (OFFICE): Rapid Strep A Screen: NEGATIVE

## 2021-02-27 MED ORDER — AMOXICILLIN 875 MG PO TABS: 875.0000 mg | ORAL_TABLET | Freq: Two times a day (BID) | ORAL | 0 refills | Status: AC

## 2021-02-27 NOTE — ED Provider Notes (Signed)
Tony Pham    CSN: [id number] Arrival date & time: 02/27/21  2122      History   Chief Complaint Chief Complaint  Patient presents with   Sore Throat   Otalgia    HPI Tony Pham is a 30 y.o. male.  Patient presents with 3-day history of right earache and sore throat.  Treatment at home with sore throat spray and lozenges.  He denies fever, rash, cough, shortness of breath, vomiting, diarrhea, or other symptoms.  He had a negative COVID test at home.  His medical history includes allergies, asthma, OSA, hypothyroidism, thyroid carcinoma.  The history is provided by the patient and medical records.   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 Authorizing Provider  amoxicillin (AMOXIL) 875 MG tablet Take 1 tablet (875 mg total) by mouth 2 (two) times daily for 7 days. 02/27/21 03/06/21 Yes Sharion Balloon, NP  fluticasone (FLONASE) 50 MCG/ACT nasal spray Place 1 spray into both nostrils daily.   Yes [provider]  levothyroxine (SYNTHROID) 112 MCG tablet Take 112 mcg by mouth daily before breakfast.   Yes [provider]  levothyroxine (SYNTHROID) 125 MCG tablet Take 125 mcg by mouth daily before breakfast.   Yes [provider]  loratadine (CLARITIN) 10 MG tablet Take 10 mg by mouth daily.   Yes [provider]  montelukast (SINGULAIR) 10 MG tablet Take 10 mg by mouth at  bedtime.   Yes [provider]  Multiple Vitamin (MULTIVITAMIN) capsule Take 1 capsule by mouth daily.   Yes [provider]  Vitamin D, Ergocalciferol, (DRISDOL) 50000 units CAPS capsule Take 2,000 Units by mouth every morning.    Yes [provider]  albuterol (VENTOLIN HFA) 108 (90 Base) MCG/ACT inhaler Inhale 2 puffs into the lungs every 6 (six) hours as needed for wheezing or shortness of breath.    [provider]  buPROPion (WELLBUTRIN XL) 300 MG 24 hr tablet Take 1 tablet (300 mg total) by mouth daily. 03/12/20 03/12/21  Arfeen, Arlyce Harman, MD  ibuprofen (ADVIL) 600 MG tablet Take 1 tablet (600 mg total) by mouth every 6 (six) hours as needed. 12/01/19   Melynda Ripple, MD  traZODone (DESYREL) 50 MG tablet Take 1 tablet (50 mg total) by mouth at bedtime as needed for sleep. 03/12/20   Arfeen, Arlyce Harman, MD    Family History Family History  Problem Relation Age of Onset   Schizophrenia Sister    Depression Sister     Social History Social History   Tobacco Use   Smoking status: Never   Smokeless tobacco: Never  Vaping Use   Vaping Use: Never used  Substance Use Topics   Alcohol use: No   Drug use: No     Allergies   Patient has no known allergies.   Review of Systems Review of Systems  Constitutional:  Negative for chills and fever.  HENT:  Positive for ear pain and sore throat.   Respiratory:  Negative for cough and shortness of breath.   Cardiovascular:  Negative for chest pain and palpitations.  Gastrointestinal:  Negative for abdominal pain, diarrhea and vomiting.  Skin:  Negative for color change and rash.  All other systems reviewed and are negative.   Physical Exam Triage Vital Signs ED Triage Vitals  Enc Vitals Group     BP      Pulse      Resp      Temp      Temp src      SpO2      Weight      Height      Head Circumference      Peak Flow      Pain Score      Pain Loc      Pain Edu?      Excl. in [place]?    No  data found.  Updated Vital Signs BP 109/68 (BP Location: Left Arm)   Pulse 74   Temp 98.3 F (36.8 C) (Oral)   Resp 18   SpO2 96%   Visual Acuity Right Eye Distance:   Left Eye Distance:   Bilateral Distance:    Right Eye Near:   Left Eye Near:    Bilateral Near:     Physical Exam Vitals and nursing note reviewed.  Constitutional:      General: He is not in acute distress.    Appearance: He is well-developed. He is not ill-appearing.  HENT:     Head: Normocephalic and atraumatic.     Right Ear: Tympanic membrane is erythematous.     Left Ear: Tympanic membrane is erythematous.     Nose: Nose normal.     Mouth/Throat:     Mouth: Mucous membranes are moist.     Pharynx: Posterior oropharyngeal erythema present.  Eyes:     Conjunctiva/sclera: Conjunctivae normal.  Cardiovascular:     Rate and Rhythm: Normal rate and regular rhythm.     Heart sounds: Normal heart sounds.  Pulmonary:     Effort: Pulmonary effort is normal. No respiratory distress.     Breath sounds: Normal breath sounds.  Abdominal:     Palpations: Abdomen is soft.     Tenderness: There is no abdominal tenderness.  Musculoskeletal:     Cervical back: Neck supple.  Skin:    General: Skin is warm and dry.  Neurological:     General: No focal deficit present.     Mental Status: He is alert and oriented to person, place, and time.     Gait: Gait normal.  Psychiatric:        Mood and Affect: Mood normal.        Behavior: Behavior normal.     UC Treatments / Results  Labs (all labs ordered are listed, but only abnormal results are displayed) Labs Reviewed  CULTURE, GROUP A STREP [facility])  POCT RAPID STREP A (OFFICE)    EKG   Radiology No results found.  Procedures Procedures (including critical care time)  Medications Ordered in UC Medications - No data to display  Initial Impression / Assessment and Plan / UC Course  I have reviewed the triage vital signs and the nursing  notes.  Pertinent labs & imaging results that were available during my care of the patient were reviewed by me and considered in my medical decision making (see  chart for details).   Bilateral otitis media, sore throat.  Treating with amoxicillin.  Throat culture pending.  Patient declines COVID test today.  Discussed symptomatic treatment with Tylenol.  Instructed him to follow-up with his PCP if his symptoms are not improving.  Patient agrees to plan of care.   Final Clinical Impressions(s) / UC Diagnoses   Final diagnoses:  Bilateral otitis media, unspecified otitis media type  Sore throat     Discharge Instructions      Take the amoxicillin as directed.    Follow up with your primary care provider if your symptoms are not improving.         ED Prescriptions     Medication Sig Dispense Auth. Provider   amoxicillin (AMOXIL) 875 MG tablet Take 1 tablet (875 mg total) by mouth 2 (two) times daily for 7 days. 14 tablet Sharion Balloon, NP      PDMP not reviewed this encounter.   Sharion Balloon, NP 02/27/21 [phone]

## 2021-02-27 NOTE — Discharge Instructions (Addendum)
Take the amoxicillin as directed.  Follow up with your primary care provider if your symptoms are not improving.   ° ° °

## 2021-02-27 NOTE — ED Triage Notes (Addendum)
Patient c/o sore throat and RT sided ear ache x 3 days.   Patient denies fever at home.  Patient denies sinus pressure, nasal congestion, hearing changes and headache.   Patient states " any time I try to swallow it hurts so bad".   Patient performed a at home COVID-19 test with negative test results per patient statement.   Patient has used sore throat spray and lozenges with no relief of symptoms.

## 2021-03-02 LAB — CULTURE, GROUP A STREP (THRC)

## 2021-03-21 ENCOUNTER — Encounter: Payer: Self-pay | Admitting: Podiatry

## 2021-03-21 ENCOUNTER — Other Ambulatory Visit: Payer: Self-pay

## 2021-03-21 ENCOUNTER — Ambulatory Visit (INDEPENDENT_AMBULATORY_CARE_PROVIDER_SITE_OTHER): Payer: 59

## 2021-03-21 ENCOUNTER — Ambulatory Visit: Payer: 59 | Admitting: Podiatry

## 2021-03-21 DIAGNOSIS — M722 Plantar fascial fibromatosis: Secondary | ICD-10-CM

## 2021-03-21 MED ORDER — METHYLPREDNISOLONE 4 MG PO TBPK: ORAL_TABLET | ORAL | 0 refills | Status: DC

## 2021-03-21 MED ORDER — BETAMETHASONE SOD PHOS & ACET 6 (3-3) MG/ML IJ SUSP: 3.0000 mg | Freq: Once | INTRAMUSCULAR | Status: DC

## 2021-03-21 NOTE — Progress Notes (Signed)
   Subjective: 30 y.o. male presenting to the office today for left heel pain is been going on for about 4 months now.  He denies history of injury.  He cannot recollect any incident that would have initiated his pain.  It is painful when getting out of bed in the mornings.  He did go to his PCP who recommended heel cups.  This did not do anything to help alleviate his pain.  He presents for further treatment and evaluation  Past Medical History:  Diagnosis Date   Depression    Seasonal allergies    Thyroid cancer ([place])      Objective: Physical Exam General: The patient is alert and oriented x3 in no acute distress.  Dermatology: Skin is warm, dry and supple bilateral lower extremities. Negative for open lesions or macerations bilateral.   Vascular: Dorsalis Pedis and Posterior Tibial pulses palpable bilateral.  Capillary fill time is immediate to all digits.  Neurological: Epicritic and protective threshold intact bilateral.   Musculoskeletal: Tenderness to palpation to the plantar aspect of the left heel along the plantar fascia. All other joints range of motion within normal limits bilateral. Strength 5/5 in all groups bilateral.   Radiographic exam: Normal osseous mineralization. Joint spaces preserved. No fracture/dislocation/boney destruction. No other soft tissue abnormalities or radiopaque foreign bodies.   Assessment: 1. Plantar fasciitis left foot  Plan of Care:  1. Patient evaluated. Xrays reviewed.   2. Injection of 0.5cc Celestone soluspan injected into the left plantar fascia.  3. Rx for Medrol Dose Pak placed 4.  Patient has a prescription for meloxicam 15 mg at home.  Take daily after completion of the Dosepak 5. Plantar fascial band(s) dispensed  6. Instructed patient regarding therapies and modalities at home to alleviate symptoms.  7. Return to clinic in 4 weeks.    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 Edrick Kins, DPM    [address], [place] [address]                Office [phone]  Fax [phone]

## 2021-04-18 ENCOUNTER — Ambulatory Visit (INDEPENDENT_AMBULATORY_CARE_PROVIDER_SITE_OTHER): Payer: Commercial Managed Care - PPO | Admitting: Podiatry

## 2021-04-18 ENCOUNTER — Other Ambulatory Visit: Payer: Self-pay

## 2021-04-18 DIAGNOSIS — M722 Plantar fascial fibromatosis: Secondary | ICD-10-CM

## 2021-04-18 MED ORDER — BETAMETHASONE SOD PHOS & ACET 6 (3-3) MG/ML IJ SUSP: 3.0000 mg | Freq: Once | INTRAMUSCULAR | Status: DC

## 2021-04-18 NOTE — Progress Notes (Signed)
   Subjective: 30 y.o. male presenting to the office today for follow-up evaluation of plantar fasciitis to the left foot this been going on for about 5 months now.  Patient states there is only some minimal improvement.  He did feel better for about 1 week after the visit however the pain quickly recurred.  He presents for further treatment and evaluation.  He has been taking the meloxicam daily and wearing the plantar fascial brace as instructed  Past Medical History:  Diagnosis Date   Depression    Seasonal allergies    Thyroid cancer ([place])      Objective: Physical Exam General: The patient is alert and oriented x3 in no acute distress.  Dermatology: Skin is warm, dry and supple bilateral lower extremities. Negative for open lesions or macerations bilateral.   Vascular: Dorsalis Pedis and Posterior Tibial pulses palpable bilateral.  Capillary fill time is immediate to all digits.  Neurological: Epicritic and protective threshold intact bilateral.   Musculoskeletal: Tenderness to palpation to the plantar aspect of the left heel along the plantar fascia. All other joints range of motion within normal limits bilateral. Strength 5/5 in all groups bilateral.    Assessment: 1. Plantar fasciitis left foot  Plan of Care:  1. Patient evaluated. Xrays reviewed.   2. Injection of 0.5cc Celestone soluspan injected into the left plantar fascia.  3.  Continue meloxicam 15 mg daily 4.  Continue plantar fascial brace. 5.  OTC power step insoles were provided for the patient to wear in his work boots daily 6.  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 Edrick Kins, DPM    [address], [place] [address]                Office [phone]  Fax [phone]

## 2021-05-19 ENCOUNTER — Other Ambulatory Visit: Payer: Self-pay

## 2021-05-19 ENCOUNTER — Ambulatory Visit: Payer: Commercial Managed Care - PPO | Admitting: Podiatry

## 2021-05-19 DIAGNOSIS — M722 Plantar fascial fibromatosis: Secondary | ICD-10-CM | POA: Diagnosis not present

## 2021-06-04 MED ORDER — BETAMETHASONE SOD PHOS & ACET 6 (3-3) MG/ML IJ SUSP: 3.0000 mg | Freq: Once | INTRAMUSCULAR | Status: DC

## 2021-06-04 NOTE — Progress Notes (Signed)
   Subjective: 30 y.o. male presenting to the office today for follow-up evaluation of plantar fasciitis to the left foot.  Patient states that overall he is doing much better.  He says the injections have slowly helped.  Also the power step insoles helped however he states that the feels that they are not providing enough support.  Overall improvement however.  No new complaints at this time  Past Medical History:  Diagnosis Date   Depression    Seasonal allergies    Thyroid cancer ([place])      Objective: Physical Exam General: The patient is alert and oriented x3 in no acute distress.  Dermatology: Skin is warm, dry and supple bilateral lower extremities. Negative for open lesions or macerations bilateral.   Vascular: Dorsalis Pedis and Posterior Tibial pulses palpable bilateral.  Capillary fill time is immediate to all digits.  Neurological: Epicritic and protective threshold intact bilateral.   Musculoskeletal: Tenderness to palpation to the plantar aspect of the left heel along the plantar fascia. All other joints range of motion within normal limits bilateral. Strength 5/5 in all groups bilateral.    Assessment: 1. Plantar fasciitis left foot  Plan of Care:  1. Patient evaluated. 2. Injection of 0.5cc Celestone soluspan injected into the left plantar fascia.  3.  Continue meloxicam 15 mg daily 4.  Continue OTC power step insoles that were provided.  Patient states that they helped significantly 5.  Today the patient was molded for custom molded orthotics 6.  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 Edrick Kins, DPM    [address] [place], [place] [address]                Office [phone]  Fax [phone]

## 2021-06-13 ENCOUNTER — Other Ambulatory Visit: Payer: Self-pay | Admitting: Family Medicine

## 2021-06-13 DIAGNOSIS — R229 Localized swelling, mass and lump, unspecified: Secondary | ICD-10-CM

## 2021-06-23 ENCOUNTER — Other Ambulatory Visit: Payer: Self-pay

## 2021-06-23 ENCOUNTER — Ambulatory Visit
Admission: RE | Admit: 2021-06-23 | Discharge: 2021-06-23 | Disposition: A | Discharge status: Home / Self Care | Payer: Commercial Managed Care - PPO | Source: Ambulatory Visit | Attending: Family Medicine | Admitting: Family Medicine

## 2021-06-23 DIAGNOSIS — R229 Localized swelling, mass and lump, unspecified: Secondary | ICD-10-CM | POA: Insufficient documentation

## 2021-06-29 ENCOUNTER — Other Ambulatory Visit: Payer: Self-pay | Admitting: Physician Assistant

## 2021-06-29 DIAGNOSIS — R229 Localized swelling, mass and lump, unspecified: Secondary | ICD-10-CM

## 2021-07-03 ENCOUNTER — Other Ambulatory Visit: Payer: Self-pay | Admitting: Physician Assistant

## 2021-07-03 ENCOUNTER — Ambulatory Visit
Admission: RE | Admit: 2021-07-03 | Discharge: 2021-07-03 | Disposition: A | Discharge status: Home / Self Care | Payer: Commercial Managed Care - PPO | Source: Ambulatory Visit | Attending: Physician Assistant | Admitting: Physician Assistant

## 2021-07-03 DIAGNOSIS — R229 Localized swelling, mass and lump, unspecified: Secondary | ICD-10-CM

## 2021-07-07 ENCOUNTER — Other Ambulatory Visit: Payer: Self-pay

## 2021-07-07 ENCOUNTER — Ambulatory Visit
Admission: RE | Admit: 2021-07-07 | Discharge: 2021-07-07 | Disposition: A | Discharge status: Home / Self Care | Payer: Commercial Managed Care - PPO | Source: Ambulatory Visit | Attending: Physician Assistant | Admitting: Physician Assistant

## 2021-07-07 DIAGNOSIS — R229 Localized swelling, mass and lump, unspecified: Secondary | ICD-10-CM | POA: Diagnosis not present

## 2021-07-07 MED ORDER — GADOBUTROL 1 MMOL/ML IV SOLN
10.0000 mL | Freq: Once | INTRAVENOUS | Status: AC | PRN
  Administered 2021-07-07: 10 mL via INTRAVENOUS

## 2021-07-14 ENCOUNTER — Ambulatory Visit: Payer: Commercial Managed Care - PPO | Admitting: Podiatry

## 2021-07-21 ENCOUNTER — Other Ambulatory Visit: Payer: Self-pay

## 2021-07-21 ENCOUNTER — Ambulatory Visit (INDEPENDENT_AMBULATORY_CARE_PROVIDER_SITE_OTHER): Payer: Commercial Managed Care - PPO | Admitting: Podiatry

## 2021-07-21 DIAGNOSIS — M722 Plantar fascial fibromatosis: Secondary | ICD-10-CM

## 2021-07-21 NOTE — Patient Instructions (Signed)

## 2021-07-21 NOTE — Progress Notes (Signed)
Patient presents to pick up orthotics.  Instructions were given.  Patient advised to return in 4 weeks.

## 2021-11-03 ENCOUNTER — Ambulatory Visit
Admission: EM | Admit: 2021-11-03 | Discharge: 2021-11-03 | Disposition: A | Discharge status: Home / Self Care | Payer: Commercial Managed Care - PPO | Attending: Family Medicine | Admitting: Family Medicine

## 2021-11-03 ENCOUNTER — Encounter: Payer: Self-pay | Admitting: Emergency Medicine

## 2021-11-03 ENCOUNTER — Other Ambulatory Visit: Payer: Self-pay

## 2021-11-03 DIAGNOSIS — H6693 Otitis media, unspecified, bilateral: Secondary | ICD-10-CM | POA: Diagnosis not present

## 2021-11-03 DIAGNOSIS — J219 Acute bronchiolitis, unspecified: Secondary | ICD-10-CM

## 2021-11-03 MED ORDER — PREDNISONE 20 MG PO TABS: 40.0000 mg | ORAL_TABLET | Freq: Every day | ORAL | 0 refills | Status: DC

## 2021-11-03 MED ORDER — ALBUTEROL SULFATE (2.5 MG/3ML) 0.083% IN NEBU: 2.5000 mg | INHALATION_SOLUTION | Freq: Four times a day (QID) | RESPIRATORY_TRACT | 0 refills | Status: AC | PRN

## 2021-11-03 MED ORDER — PROMETHAZINE-DM 6.25-15 MG/5ML PO SYRP: 5.0000 mL | ORAL_SOLUTION | Freq: Four times a day (QID) | ORAL | 0 refills | Status: DC | PRN

## 2021-11-03 MED ORDER — DOXYCYCLINE HYCLATE 100 MG PO CAPS: 100.0000 mg | ORAL_CAPSULE | Freq: Two times a day (BID) | ORAL | 0 refills | Status: DC

## 2021-11-03 NOTE — Discharge Instructions (Addendum)
If you develop any chest pain or severe worsening of breathing go immediately to the emergency department.  Take all medication as prescribed.  Continue use of your nebulizer treatments every 6 hours.  Follow-up with primary care provider or return here as needed.

## 2021-11-03 NOTE — ED Triage Notes (Signed)
Pt here with 8 days of bilateral otalgia, chest congestion and cough and nasal congestion.

## 2021-11-03 NOTE — ED Provider Notes (Signed)
Tony Pham    CSN: [id number] Arrival date & time: 11/03/21  1005      History   Chief Complaint Chief Complaint  Patient presents with   Cough   Nasal Congestion   Otalgia    HPI Tony Pham is a 31 y.o. male.   HPI Patient with a history of allergy induced asthma, presents today with a 8-day history of bilateral ear pain, wheezing and shortness of breath, persistent cough with nasal congestion. Patient reports treating symptoms with over-the-counter medication and using his home nebulizer treatments without any relief of pain.  He reports drainage from his left ear which is discolored.  He endorses pain and pressure in both ears.  He is afebrile.  He denies any chest pain although endorses some chest tightness and is currently not short of breath.  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 Authorizing Provider  albuterol (PROVENTIL) (2.5 MG/3ML) 0.083% nebulizer solution Take 3 mLs (2.5 mg total) by nebulization every 6 (six) hours as needed for wheezing or shortness of breath. 11/03/21  Yes Scot Jun, FNP  doxycycline (VIBRAMYCIN) 100 MG capsule Take 1 capsule (100 mg total) by mouth 2 (two) times daily. 11/03/21  Yes Scot Jun, FNP  predniSONE  (DELTASONE) 20 MG tablet Take 2 tablets (40 mg total) by mouth daily with breakfast. 11/03/21  Yes Scot Jun, FNP  promethazine-dextromethorphan (PROMETHAZINE-DM) 6.25-15 MG/5ML syrup Take 5 mLs by mouth 4 (four) times daily as needed for cough. 11/03/21  Yes Scot Jun, FNP  albuterol (VENTOLIN HFA) 108 (90 Base) MCG/ACT inhaler Inhale 2 puffs into the lungs every 6 (six) hours as needed for wheezing or shortness of breath.    [provider]  azelastine (ASTELIN) 0.1 % nasal spray Place into the nose.    [provider]  Cholecalciferol (VITAMIN D3) 50 MCG (2000 UT) CAPS Take by mouth.    [provider]  fluticasone (FLONASE) 50 MCG/ACT nasal spray Place into the nose. 01/02/21   [provider]  ibuprofen (ADVIL) 600 MG tablet Take 1 tablet (600 mg total) by mouth every 6 (six) hours as needed. 12/01/19   Melynda Ripple, MD  levothyroxine (SYNTHROID) 125 MCG tablet Take 125 mcg by mouth daily before breakfast.    [provider]  loratadine (CLARITIN) 10 MG tablet Take 10 mg by mouth daily.    [provider]  methylPREDNISolone (MEDROL DOSEPAK) 4 MG TBPK tablet 6 day dose pack - take as directed 03/21/21   Edrick Kins, DPM  montelukast (SINGULAIR) 10 MG tablet Take 1 tablet by mouth daily. 05/06/20   [provider]  Multiple Vitamin (MULTIVITAMIN) capsule Take 1 capsule by mouth daily.    [provider]    Family History Family History  Problem Relation Age of Onset   Schizophrenia Sister    Depression Sister     Social History Social History   Tobacco Use   Smoking status: Never   Smokeless tobacco: Never  Vaping Use   Vaping Use: Never used  Substance Use Topics   Alcohol use: No   Drug use: No     Allergies   Patient has no known allergies.   Review of Systems Review of Systems Pertinent negatives listed in HPI  Physical Exam Triage Vital Signs ED Triage Vitals  Enc Vitals  Group     BP 11/03/21 1028 135/85     Pulse Rate 11/03/21 1028 92     Resp 11/03/21 1028 18     Temp 11/03/21 1028 98.6 F (37 C)     Temp Source 11/03/21 1028 Oral     SpO2 11/03/21 1028 95 %     Weight --      Height --      Head Circumference --      Peak Flow --      Pain Score 11/03/21 1035 4     Pain Loc --      Pain Edu? --      Excl. in [place]? --    No data found.  Updated Vital Signs BP 135/85 (BP Location: Left Arm)    Pulse 92    Temp 98.6 F (37 C) (Oral)    Resp 18    SpO2 95%   Visual Acuity Right Eye Distance:   Left Eye Distance:   Bilateral Distance:    Right Eye Near:   Left Eye Near:    Bilateral Near:     Physical Exam Constitutional:      Appearance: Normal appearance.  HENT:     Head: Normocephalic.     Right Ear: A middle ear effusion is present. Tympanic membrane is erythematous and bulging.     Left Ear: A middle ear effusion is present. Tympanic membrane is erythematous and bulging.     Nose: Nasal tenderness and mucosal edema present.     Right Turbinates: Swollen.     Left Turbinates: Swollen.     Mouth/Throat:     Pharynx: Oropharynx is clear. Uvula midline.  Cardiovascular:     Rate and Rhythm: Normal rate and regular rhythm.  Pulmonary:     Effort: Pulmonary effort is normal.     Breath sounds: Decreased air movement present. Examination of the right-upper field reveals wheezing. Examination of the left-upper field reveals wheezing. Examination of the right-middle field reveals wheezing. Examination of the left-middle field reveals wheezing. Wheezing present.  Musculoskeletal:        General: Normal range of motion.  Skin:    General: Skin is warm.     Capillary Refill: Capillary refill takes less than 2 seconds.  Neurological:     General: No focal deficit present.     Mental Status: He is alert.  Psychiatric:        Mood and Affect: Mood normal.        Behavior: Behavior normal.        Thought Content: Thought content normal.         Judgment: Judgment normal.     UC Treatments / Results  Labs (all labs ordered are listed, but only abnormal results are displayed) Labs Reviewed - No data to display  EKG   Radiology No results found.  Procedures Procedures (including critical care time)  Medications Ordered in UC Medications - No data to display  Initial Impression / Assessment and Plan / UC Course  I have reviewed the triage vital signs and the nursing notes.  Pertinent labs & imaging results that were available during my care of the patient were reviewed by me and considered in my medical decision making (see chart for details).    Bilateral otitis media treatment with doxycycline twice daily for 10 days.  Also covering for acute bronchitis with prednisone and encourage patient to regularly administer his nebulizer treatments every 6 hours as he is actively wheezing.  Promethazine DM as needed for cough.  Strict return precautions if symptoms do not readily improve.  ER if symptoms worsen. Final Clinical Impressions(s) / UC Diagnoses   Final diagnoses:  Acute bilateral otitis media  Acute bronchiolitis due to unspecified organism     Discharge Instructions      If you develop any chest pain or severe worsening of breathing go immediately to the emergency department.  Take all medication as prescribed.  Continue use of your nebulizer treatments every 6 hours.  Follow-up with primary care provider or return here as needed.     ED Prescriptions     Medication Sig Dispense Auth. Provider   albuterol (PROVENTIL) (2.5 MG/3ML) 0.083% nebulizer solution Take 3 mLs (2.5 mg total) by nebulization every 6 (six) hours as needed for wheezing or shortness of breath. 360 mL Scot Jun, FNP   doxycycline (VIBRAMYCIN) 100 MG capsule Take 1 capsule (100 mg total) by mouth 2 (two) times daily. 20 capsule Scot Jun, FNP   predniSONE (DELTASONE) 20 MG tablet Take 2 tablets (40 mg total) by mouth  daily with breakfast. 10 tablet Scot Jun, FNP   promethazine-dextromethorphan (PROMETHAZINE-DM) 6.25-15 MG/5ML syrup Take 5 mLs by mouth 4 (four) times daily as needed for cough. 180 mL Scot Jun, FNP      PDMP not reviewed this encounter.   Scot Jun, FNP 11/03/21 1110

## 2022-01-19 IMAGING — MR MR [PERSON_NAME] LOW WO/W CM*R*
4 of 8 series · 16 of 40 positions shown · IV contrast (gadavist)
Comparison: None.

CLINICAL DATA: Patient noticed 2 areas of swelling on right lower
leg and 1 area on left. Noticed a few months ago and they seem to
have gotten bigger.

EXAM:
MRI OF LOWER LEFT EXTREMITY WITHOUT AND WITH CONTRAST; MRI OF LOWER
RIGHT EXTREMITY WITHOUT AND WITH CONTRAST
TECHNIQUE: Multiplanar, multisequence MR imaging of the right lower leg was
performed both before and after administration of intravenous
contrast.
Multiplanar, multisequence MR imaging of the left lower leg was
CONTRAST:  10mL GADAVIST GADOBUTROL 1 MMOL/ML IV SOLN

[Series 4: t1_tse_cor_480_bilat_ · coronal · B · 4.0mm · 0.46mm/px · 2 of 34 slices shown]
[im 1/34]
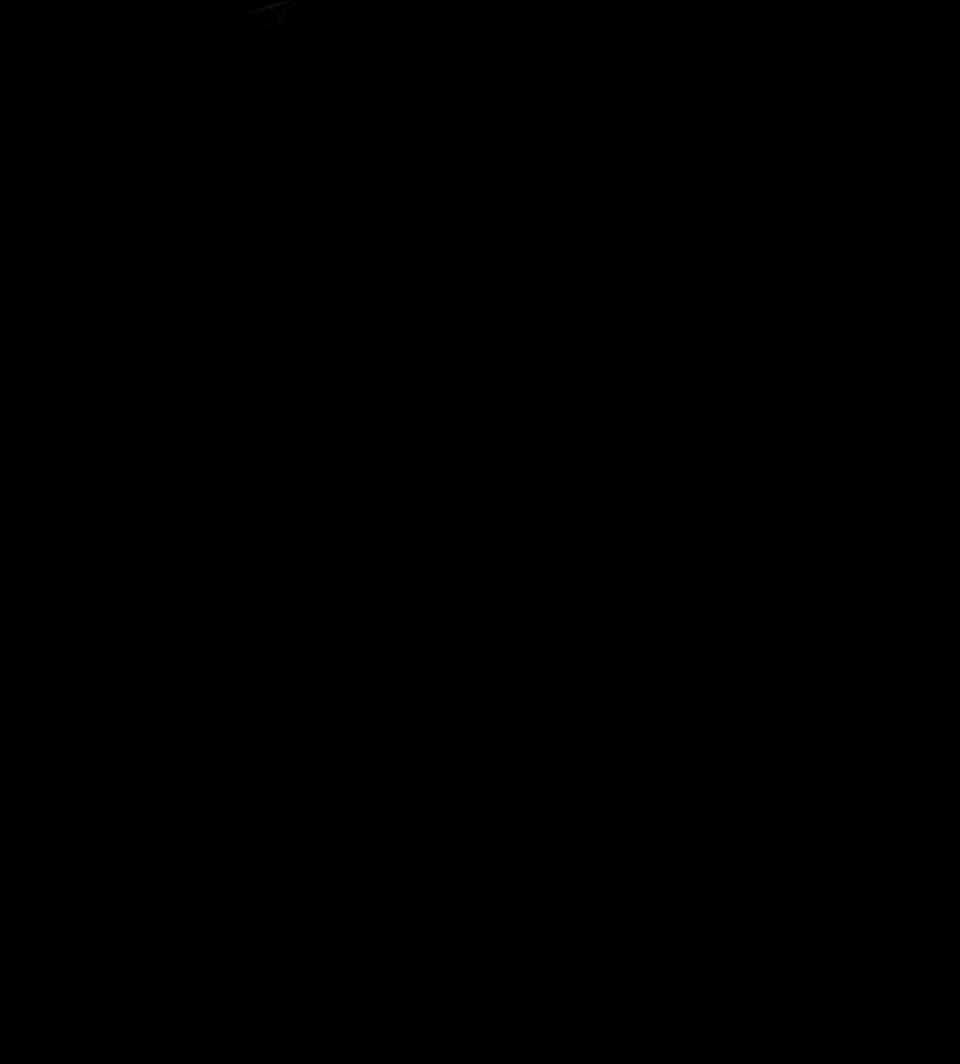
[im 34/34]
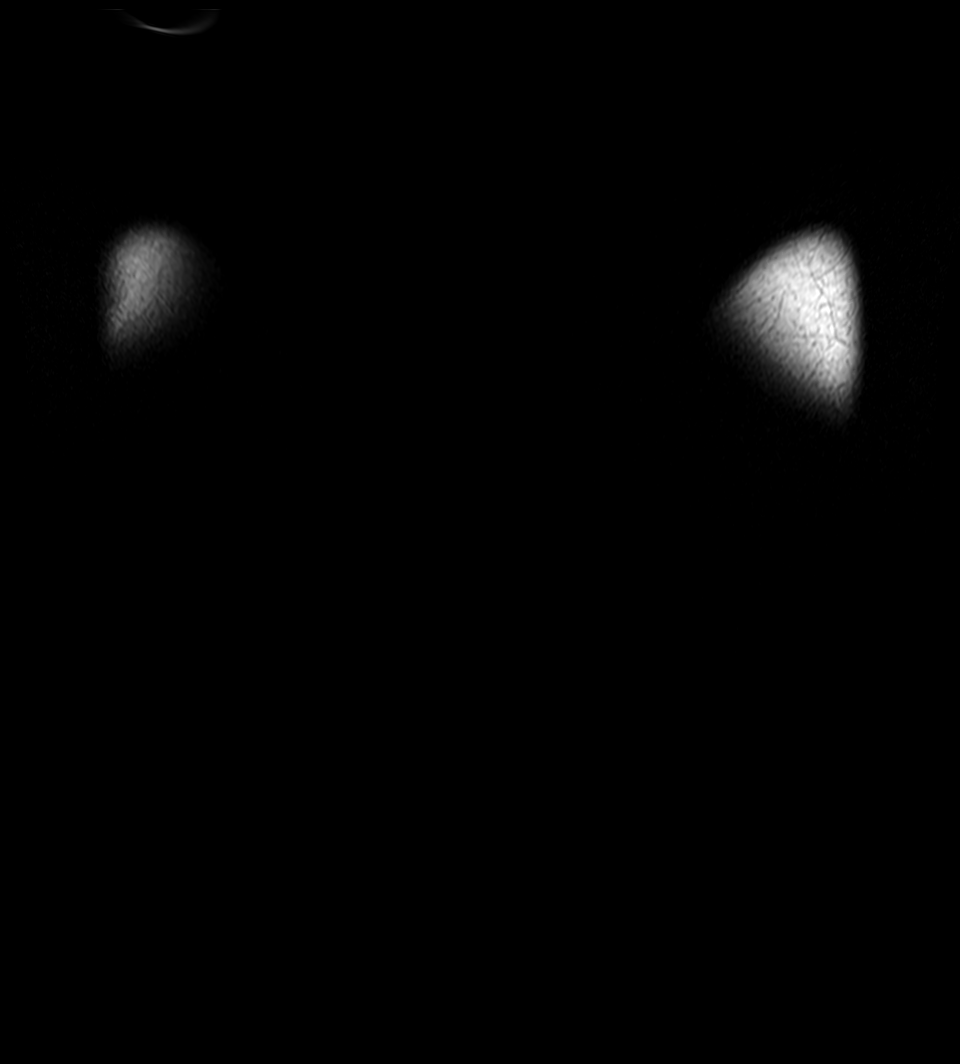

[Series 5: t2_tse_stir_cor_352_bilat_ · coronal · B · 4.0mm · 0.62mm/px · 2 of 33 slices shown]
[im 1/33]
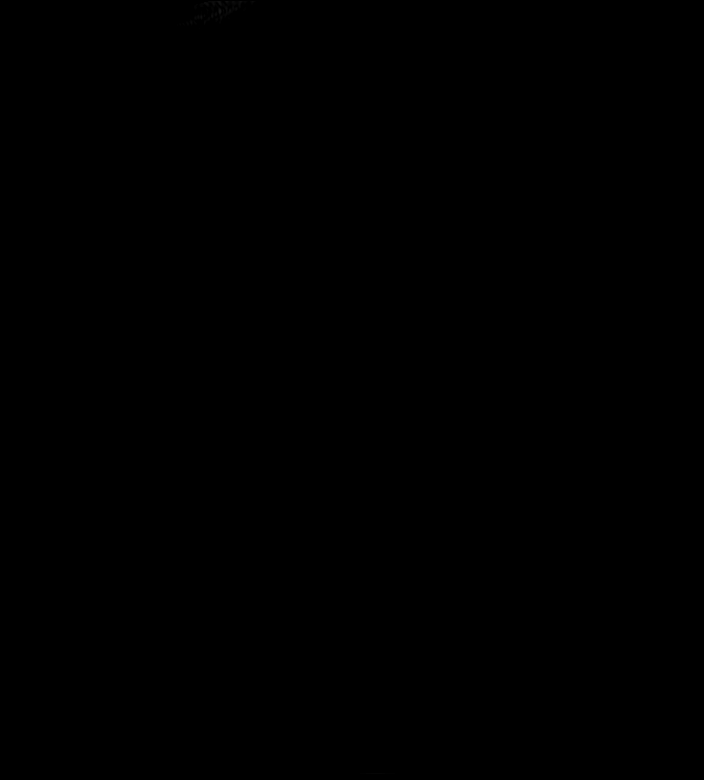
[im 33/33]
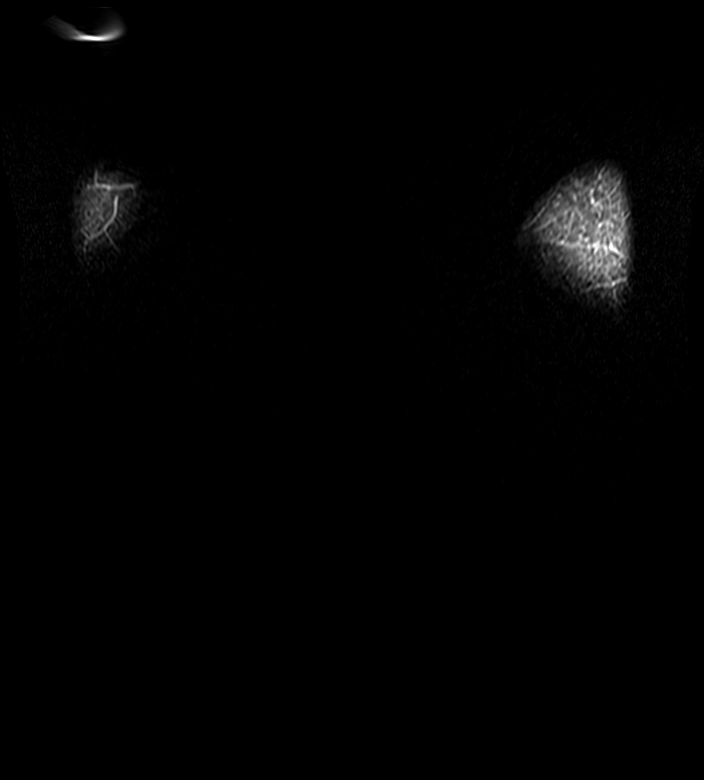

[Series 15: T1 fat-sat · axial · B · 4.0mm · 0.70mm/px · z∈[-220,+207]mm · 8 of 88 slices shown (1 of 2)]
[im 1/88]
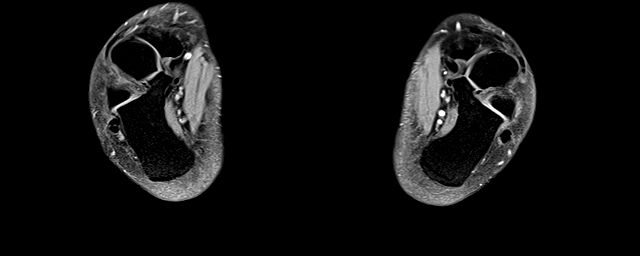
[im 13/88]
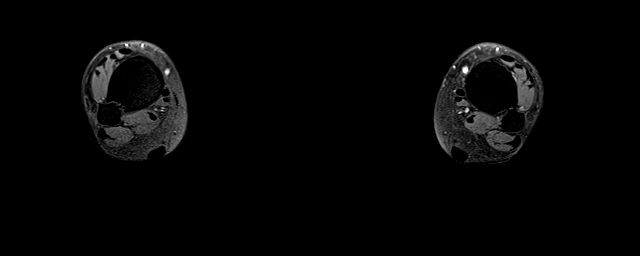
[im 25/88]
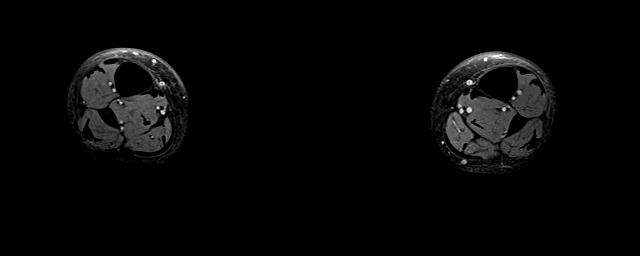
[im 38/88]
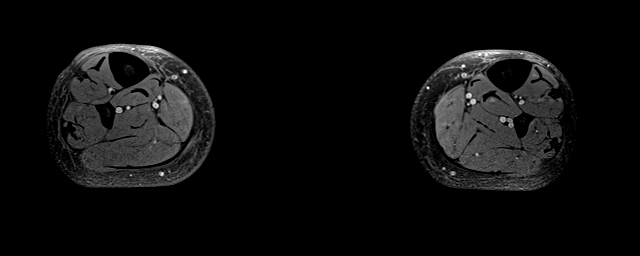
[im 50/88]
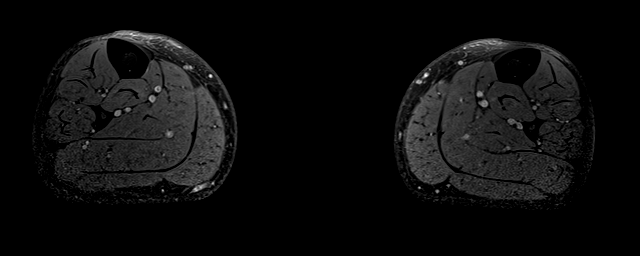
[im 63/88]
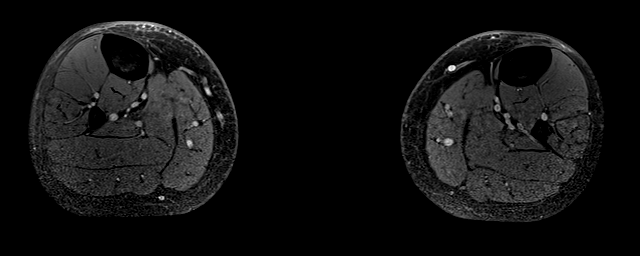
[im 75/88]
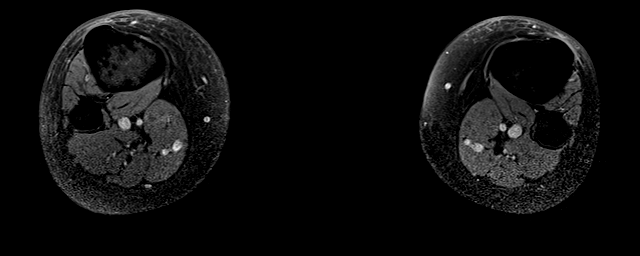
[im 88/88]
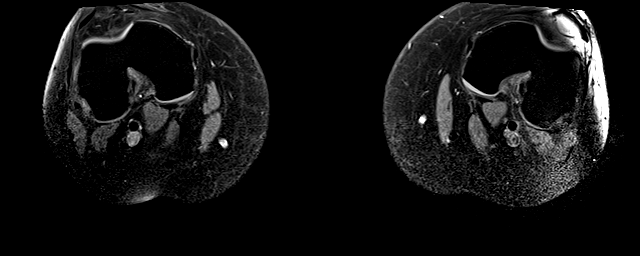

[Series 18: T1 fat-sat · axial · B · 4.0mm · 0.70mm/px · z∈[-220,+143]mm · 4 of 88 slices shown (2 of 2)]
[im 1/88]
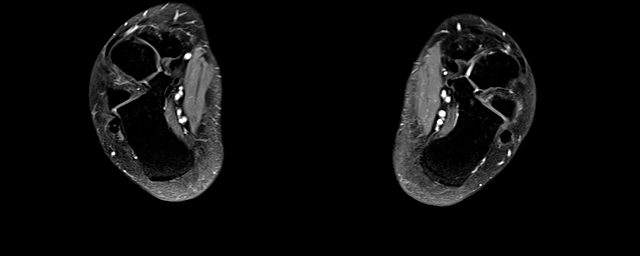
[im 13/88]
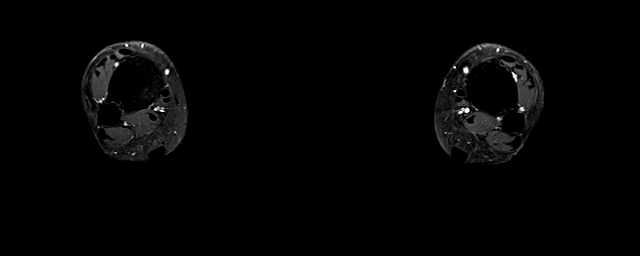
[im 50/88]
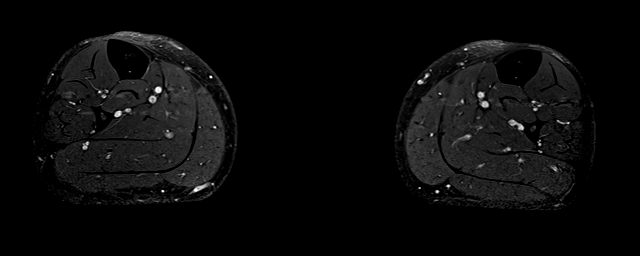
[im 75/88]
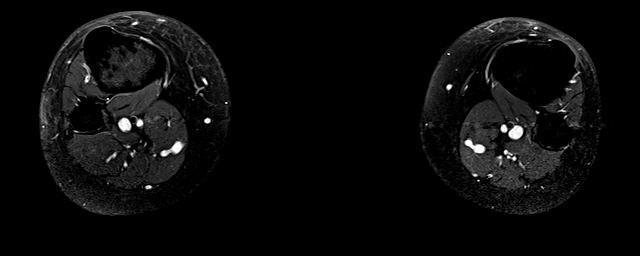

[16 of 40 positions shown; findings below may reference images not displayed]

FINDINGS: Bones/Joint/Cartilage

No fracture or dislocation. Normal alignment. No joint effusion. No
marrow signal abnormality.

Two small ossified nonossifying fibroma in the right distal femoral
diametaphysis.

Ligaments

Collateral ligaments are intact.

Muscles and Tendons

Flexor, extensor, peroneal, and Achilles tendons are intact. Muscles
are normal.

Soft tissue
No fluid collection or hematoma. No soft tissue mass. Nonspecific
mild pretibial edema in the subcutaneous fat bilaterally.
IMPRESSION: 1. No soft tissue mass, fluid collection or hematoma of bilateral
lower legs at the site of clinical concern.
2. Nonspecific mild pretibial edema in the subcutaneous fat
bilaterally.
3.  No acute osseous injury of the right lower leg.
4.  No acute osseous injury of the left lower leg.

## 2022-10-12 ENCOUNTER — Encounter: Payer: Self-pay | Admitting: Physician Assistant

## 2022-11-01 NOTE — Progress Notes (Signed)
11/02/2022 ASAAD WOHLER [id number] 1990-09-22  Referring provider: Sharyne Peach, MD Primary GI doctor: Dr. 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   Will increase PPI to twice daily, emphasizing before food. I discussed risks of EGD with patient today, including risk of sedation, bleeding or perforation.  Patient provides understanding and gave verbal consent to proceed.  Rectal bleeding with frequent loose/formed stools Add fiber Schedule colon at the hospital We have discussed the risks of bleeding, infection, perforation, medication reactions, and remote risk of death associated with colonoscopy. All questions were answered and the patient acknowledges these risk and wishes to proceed.  Thyroid carcinoma (HCC) S/p thyroidectomy and RAI ablation  History of bariatric surgery History of gastric sleeve  Obstructive sleep apnea (adult) (pediatric) On CPAP  BMI 50.0-59.9, adult [facility]) Patient will be scheduled at the hospital for their procedure due to ASA IV criteria: BMI over 50   Elevated CRP   Patient Care Team: Sharyne Peach, MD as PCP - General (Family Medicine)  HISTORY OF PRESENT ILLNESS: 32 y.o. male with a past medical history of papillary thyroid cancer status post RAI ablation and thyroidectomy 2013, OSA, status post gastric sleeve 2018 and others listed below presents for evaluation of rectal bleeding and dysphagia.   10/08/2022 labs reviewed showed WBC 13, Hgb 13.5, platelets 312, MCV 90, B12 696, iron 59, percent saturation 21, TIBC 287, ferritin 79, folate 220 CRP was elevated at 3.93 Vitamin D 33, potassium 3.4, normal magnesium, normal kidney  otherwise. Alk phos 112, AST 20, ALT 21, total bili 0.4 TSH 2.92  He states use to have dysphagia but improved after thyroidectomy.  He is truck Geophysicist/field seismologist and states the other day he had chicken and rice get caught in his esophagus, worse with solid foods and can drink water.  Dad with multiple esophagus dilatations.  Denies GERD, was on omeprazole after gastric sleeve but stopped.  He has lots of burping/beltching.  He has BM 4-5 x a day, formed to semisoft stool, can have some Bm's that are black and tarry, has also had BRB with wiping and occ in the stools.  Feels incomplete BM's No AB pain.  He is on meloxicam for 1 year daily, no other NSAIDS, no ETOH. No tobacco use, no drug use.  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  Have 92 year old son.   Dad with desmoid tumor, s/p small bowel resection. No colon cancer or other GI cancer.    He  reports that he has never smoked. He has never used smokeless tobacco. He reports that he does not drink alcohol and does not use drugs.  RELEVANT LABS AND IMAGING:  Current Medications:   Current Outpatient Medications (Endocrine & Metabolic):    levothyroxine (SYNTHROID) 125 MCG tablet, Take 125 mcg by mouth daily before breakfast.  Current Facility-Administered Medications (Endocrine & Metabolic):    betamethasone acetate-betamethasone sodium phosphate (CELESTONE) injection 3 mg   betamethasone acetate-betamethasone sodium phosphate (CELESTONE) injection 3 mg   betamethasone acetate-betamethasone sodium phosphate (CELESTONE) injection 3 mg    Current Outpatient Medications (Respiratory):    albuterol (PROVENTIL) (2.5 MG/3ML) 0.083% nebulizer solution, Take 3 mLs (2.5 mg total) by nebulization every 6 (six)  hours as needed for wheezing or shortness of breath.   albuterol (VENTOLIN HFA) 108 (90 Base) MCG/ACT inhaler, Inhale 2 puffs into the lungs every 6 (six) hours as needed for wheezing or shortness of breath.   azelastine (ASTELIN) 0.1  % nasal spray, Place into the nose.   fluticasone (FLONASE) 50 MCG/ACT nasal spray, Place into the nose.   loratadine (CLARITIN) 10 MG tablet, Take 10 mg by mouth daily.   montelukast (SINGULAIR) 10 MG tablet, Take 1 tablet by mouth daily.   promethazine-dextromethorphan (PROMETHAZINE-DM) 6.25-15 MG/5ML syrup, Take 5 mLs by mouth 4 (four) times daily as needed for cough.   Current Outpatient Medications (Analgesics):    ibuprofen (ADVIL) 600 MG tablet, Take 1 tablet (600 mg total) by mouth every 6 (six) hours as needed.     Current Outpatient Medications (Other):    Cholecalciferol (VITAMIN D3) 50 MCG (2000 UT) CAPS, Take by mouth.   doxycycline (VIBRAMYCIN) 100 MG capsule, Take 1 capsule (100 mg total) by mouth 2 (two) times daily.   Multiple Vitamin (MULTIVITAMIN) capsule, Take 1 capsule by mouth daily.   Na Sulfate-K Sulfate-Mg Sulf 17.5-3.13-1.6 GM/177ML SOLN, Take 1 kit by mouth once for 1 dose.   pantoprazole (PROTONIX) 40 MG tablet, Take 1 tablet (40 mg total) by mouth daily.   Medical History:  Past Medical History:  Diagnosis Date   Arthritis    Asthma    Depression    Obesity    Seasonal allergies    Sleep apnea    Thyroid cancer ([place])    Thyroid disease    Allergies: No Known Allergies   Surgical History:  He  has a past surgical history that includes Thyroidectomy. Family History:  His family history includes Depression in his sister; Schizophrenia in his sister.  REVIEW OF SYSTEMS  : All other systems reviewed and negative except where noted in the History of Present Illness.  PHYSICAL EXAM: BP 120/78   Pulse 98   Ht 5' 9"$  (1.753 m)   Wt (!) 390 lb (176.9 kg)   BMI 57.59 kg/m  General Appearance: Well nourished, in no apparent distress. Head:   Normocephalic and atraumatic. Eyes:  sclerae anicteric,conjunctive pink  Respiratory: Respiratory effort normal, BS equal bilaterally without rales, rhonchi, wheezing. Cardio: RRR with no MRGs. Peripheral  pulses intact.  Abdomen: Soft,  Obese ,active bowel sounds. No tenderness . Marland Kitchen No masses. Rectal: Not evaluated Musculoskeletal: Full ROM, Normal gait. Without edema. Skin:  Dry and intact without significant lesions or rashes Neuro: Alert and  oriented x4;  No focal deficits. Psych:  Cooperative. Normal mood and affect.    Vladimir Crofts, PA-C 3:47 PM

## 2022-11-02 ENCOUNTER — Ambulatory Visit: Payer: Commercial Managed Care - PPO | Admitting: Physician Assistant

## 2022-11-02 ENCOUNTER — Encounter: Payer: Self-pay | Admitting: Physician Assistant

## 2022-11-02 VITALS — BP 120/78 | HR 98 | Ht 69.0 in | Wt 390.0 lb

## 2022-11-02 DIAGNOSIS — K625 Hemorrhage of anus and rectum: Secondary | ICD-10-CM

## 2022-11-02 DIAGNOSIS — Z6841 Body Mass Index (BMI) 40.0 and over, adult: Secondary | ICD-10-CM

## 2022-11-02 DIAGNOSIS — K219 Gastro-esophageal reflux disease without esophagitis: Secondary | ICD-10-CM

## 2022-11-02 DIAGNOSIS — C73 Malignant neoplasm of thyroid gland: Secondary | ICD-10-CM | POA: Diagnosis not present

## 2022-11-02 DIAGNOSIS — R1319 Other dysphagia: Secondary | ICD-10-CM | POA: Diagnosis not present

## 2022-11-02 DIAGNOSIS — G4733 Obstructive sleep apnea (adult) (pediatric): Secondary | ICD-10-CM

## 2022-11-02 DIAGNOSIS — Z9884 Bariatric surgery status: Secondary | ICD-10-CM

## 2022-11-02 MED ORDER — PANTOPRAZOLE SODIUM 40 MG PO TBEC: 40.0000 mg | DELAYED_RELEASE_TABLET | Freq: Every day | ORAL | 3 refills | Status: DC

## 2022-11-02 MED ORDER — NA SULFATE-K SULFATE-MG SULF 17.5-3.13-1.6 GM/177ML PO SOLN: 1.0000 | Freq: Once | ORAL | 0 refills | Status: AC

## 2022-11-02 NOTE — Patient Instructions (Addendum)
You will be contacted by [facility] in the next 2 days to arrange Barium swallow.  The number on your caller ID will be [phone], please answer when they call.  If you have not heard from them in 2 days please call [phone] to schedule.     We have sent the following medications to your pharmacy for you to pick up at your convenience:  Suprep, Protonix  You have been scheduled for an endoscopy and colonoscopy. Please follow the written instructions given to you at your visit today. Please pick up your prep supplies at the pharmacy within the next 1-3 days. If you use inhalers (even only as needed), please bring them with you on the day of your procedure.    Please take your proton pump inhibitor medication, protonix 40 mg QD  Please take this medication 30 minutes to 1 hour before meals- this makes it more effective.  Stop the mobic or avoid as you can Avoid spicy and acidic foods Avoid fatty foods Limit your intake of coffee, tea, alcohol, and carbonated drinks Work to maintain a healthy weight Keep the head of the bed elevated at least 3 inches with blocks or a wedge pillow if you are having any nighttime symptoms Stay upright for 2 hours after eating Avoid meals and snacks three to four hours before bedtime   FIBER SUPPLEMENT You can do metamucil or fibercon once or twice a day but if this causes gas/bloating please switch to Benefiber or Citracel.  Fiber is good for constipation/diarrhea/irritable bowel syndrome.  It can also help with weight loss and can help lower your bad cholesterol (LDL).  Please do 1 TBSP in the morning in water, coffee, or tea.  It can take up to a month before you can see a difference with your bowel movements.  It is cheapest from [employer], [employer], [employer].

## 2022-11-05 NOTE — Progress Notes (Signed)
I agree with the assessment and plan as outlined by Ms. Collier. 

## 2022-11-12 ENCOUNTER — Other Ambulatory Visit (HOSPITAL_BASED_OUTPATIENT_CLINIC_OR_DEPARTMENT_OTHER): Payer: Self-pay

## 2022-11-12 DIAGNOSIS — G4733 Obstructive sleep apnea (adult) (pediatric): Secondary | ICD-10-CM

## 2022-11-23 ENCOUNTER — Ambulatory Visit (HOSPITAL_COMMUNITY)
Admission: RE | Admit: 2022-11-23 | Discharge: 2022-11-23 | Disposition: A | Discharge status: Home / Self Care | Payer: Commercial Managed Care - PPO | Source: Ambulatory Visit | Attending: Physician Assistant | Admitting: Physician Assistant

## 2022-11-23 DIAGNOSIS — C73 Malignant neoplasm of thyroid gland: Secondary | ICD-10-CM | POA: Diagnosis present

## 2022-11-23 DIAGNOSIS — K219 Gastro-esophageal reflux disease without esophagitis: Secondary | ICD-10-CM | POA: Diagnosis present

## 2022-11-23 DIAGNOSIS — R1319 Other dysphagia: Secondary | ICD-10-CM | POA: Insufficient documentation

## 2022-11-30 ENCOUNTER — Ambulatory Visit (HOSPITAL_BASED_OUTPATIENT_CLINIC_OR_DEPARTMENT_OTHER): Payer: Commercial Managed Care - PPO | Attending: Family Medicine | Admitting: Internal Medicine

## 2022-11-30 VITALS — Ht 69.0 in | Wt 370.0 lb

## 2022-11-30 DIAGNOSIS — G4733 Obstructive sleep apnea (adult) (pediatric): Secondary | ICD-10-CM | POA: Insufficient documentation

## 2022-12-08 DIAGNOSIS — G4733 Obstructive sleep apnea (adult) (pediatric): Secondary | ICD-10-CM | POA: Diagnosis not present

## 2022-12-08 NOTE — Procedures (Signed)
Patient Name: Tony Pham, Tony Pham Date: 11/30/2022 Gender: Male D.O.B: 1991-04-02 Age (years): 31 Referring Provider: Salome Holmes MD Height (inches): 69 Interpreting Physician: Baird Lyons MD, ABSM Weight (lbs): 370 RPSGT: Earney [place] BMI: 55 MRN: [id number] Neck Size: 19.50  CLINICAL INFORMATION Sleep Study Type: Split Night CPAP Indication for sleep study: OSA Epworth Sleepiness Score: 15  SLEEP STUDY TECHNIQUE As per the AASM Manual for the Scoring of Sleep and Associated Events v2.3 (April 2016) with a hypopnea requiring 4% desaturations.  The channels recorded and monitored were frontal, central and occipital EEG, electrooculogram (EOG), submentalis EMG (chin), nasal and oral airflow, thoracic and abdominal wall motion, anterior tibialis EMG, snore microphone, electrocardiogram, and pulse oximetry. Continuous positive airway pressure (CPAP) was initiated when the patient met split night criteria and was titrated according to treat sleep-disordered breathing.  MEDICATIONS Medications self-administered by patient taken the night of the study : none reported  RESPIRATORY PARAMETERS Diagnostic  Total AHI (/hr): 36.4 RDI (/hr): 40.1 OA Index (/hr): - CA Index (/hr): 0.0 REM AHI (/hr): N/A NREM AHI (/hr): 36.4 Supine AHI (/hr): 34.3 Non-supine AHI (/hr): 40.9 Min O2 Sat (%): 74.0 Mean O2 (%): 88.7 Time below 88% (min): 63.7   Titration  Optimal Pressure (cm): 12 AHI at Optimal Pressure (/hr): 0 Min O2 at Optimal Pressure (%): 90.0 Supine % at Optimal (%): 0 Sleep % at Optimal (%): 98   SLEEP ARCHITECTURE The recording time for the entire night was 369 minutes.  During a baseline period of 168.5 minutes, the patient slept for 145.0 minutes in REM and nonREM, yielding a sleep efficiency of 86.1%. Sleep onset after lights out was 5.9 minutes with a REM latency of N/A minutes. The patient spent 1.0% of the night in stage N1 sleep, 92.4% in stage N2 sleep, 6.6%  in stage N3 and 0% in REM.  During the titration period of 192.0 minutes, the patient slept for 185.6 minutes in REM and nonREM, yielding a sleep efficiency of 96.7%. Sleep onset after CPAP initiation was 4.4 minutes with a REM latency of 94.5 minutes. The patient spent 0.5% of the night in stage N1 sleep, 76.8% in stage N2 sleep, 0.0% in stage N3 and 22.6% in REM.  CARDIAC DATA The 2 lead EKG demonstrated sinus rhythm. The mean heart rate was 100.0 beats per minute. Other EKG findings include: None.  LEG MOVEMENT DATA The total Periodic Limb Movements of Sleep (PLMS) were 0. The PLMS index was 0.0 .  IMPRESSIONS - Severe obstructive sleep apnea occurred during the diagnostic portion of the study (AHI = 36.4/hour). An optimal PAP pressure was selected for this patient ( 12 cm of water) - Moderate oxygen desaturation was noted during the diagnostic portion of the study (Min O2 =74.0%). Minimum O2 saturation on CPAP 12 was 90%. - The patient snored with moderate snoring volume during the diagnostic portion of the study. - No cardiac abnormalities were noted during this study. - Clinically significant periodic limb movements did not occur during sleep.  DIAGNOSIS - Obstructive Sleep Apnea (G47.33)  RECOMMENDATIONS - Trial of CPAP therapy on 12 cm H2O or autopap 5-15. - Patient used a Medium size Fisher&Paykel Full Face Simplus mask and heated humidification. - Be careful with alcohol, sedatives and other CNS depressants that may worsen sleep apnea and disrupt normal sleep architecture. - Sleep hygiene should be reviewed to assess factors that may improve sleep quality. - Weight management and regular exercise should be initiated or continued.  [  Electronically signed] 12/08/2022 12:58 PM  Baird Lyons MD, [place], American Board of Sleep Medicine NPI: [id number]                        [place], [place] [facility]  ELECTRONICALLY SIGNED ON:  12/08/2022, 12:55 PM [place] PH: [phone]   FX: [phone] [place]

## 2022-12-18 ENCOUNTER — Encounter (HOSPITAL_COMMUNITY): Payer: Self-pay | Admitting: Internal Medicine

## 2022-12-23 ENCOUNTER — Encounter (HOSPITAL_COMMUNITY): Payer: Self-pay | Admitting: Internal Medicine

## 2022-12-23 NOTE — Anesthesia Preprocedure Evaluation (Signed)
Anesthesia Evaluation  Patient identified by MRN, date of birth, ID band Patient awake    Reviewed: Allergy & Precautions, NPO status , Patient's Chart, lab work & pertinent test results  History of Anesthesia Complications Negative for: history of anesthetic complications  Airway Mallampati: II  TM Distance: >3 FB Neck ROM: Full    Dental  (+) Dental Advisory Given   Pulmonary neg shortness of breath, asthma , sleep apnea (severe) , neg COPD, neg recent URI   Pulmonary exam normal breath sounds clear to auscultation       Cardiovascular negative cardio ROS  Rhythm:Regular Rate:Normal     Neuro/Psych  PSYCHIATRIC DISORDERS  Depression    negative neurological ROS     GI/Hepatic Neg liver ROS, Bowel prep,GERD  Medicated,,S/p gastric sleeve   Endo/Other  neg diabetesHypothyroidism  Morbid obesityH/o thyroid cancer s/p thyroidectomy  Renal/GU negative Renal ROS     Musculoskeletal  (+) Arthritis ,    Abdominal  (+) + obese  Peds  Hematology   Anesthesia Other Findings   Reproductive/Obstetrics                             Anesthesia Physical Anesthesia Plan  ASA: 3  Anesthesia Plan: MAC   Post-op Pain Management:    Induction: Intravenous  PONV Risk Score and Plan: 1 and Propofol infusion and Treatment may vary due to age or medical condition  Airway Management Planned: Natural Airway and Nasal Cannula  Additional Equipment:   Intra-op Plan:   Post-operative Plan:   Informed Consent: I have reviewed the patients History and Physical, chart, labs and discussed the procedure including the risks, benefits and alternatives for the proposed anesthesia with the patient or authorized representative who has indicated his/her understanding and acceptance.     Dental advisory given  Plan Discussed with: CRNA and Anesthesiologist  Anesthesia Plan Comments: (Discussed with patient  risks of MAC including, but not limited to, minor pain or discomfort, hearing people in the room, and possible need for backup general anesthesia. Risks for general anesthesia also discussed including, but not limited to, sore throat, hoarse voice, chipped/damaged teeth, injury to vocal cords, nausea and vomiting, allergic reactions, lung infection, heart attack, stroke, and death. All questions answered. )       Anesthesia Quick Evaluation

## 2022-12-24 ENCOUNTER — Other Ambulatory Visit: Payer: Self-pay

## 2022-12-24 ENCOUNTER — Ambulatory Visit (HOSPITAL_COMMUNITY): Payer: Commercial Managed Care - PPO | Admitting: Anesthesiology

## 2022-12-24 ENCOUNTER — Encounter (HOSPITAL_COMMUNITY): Payer: Self-pay | Admitting: Internal Medicine

## 2022-12-24 ENCOUNTER — Ambulatory Visit (HOSPITAL_BASED_OUTPATIENT_CLINIC_OR_DEPARTMENT_OTHER): Payer: Commercial Managed Care - PPO | Admitting: Anesthesiology

## 2022-12-24 ENCOUNTER — Encounter (HOSPITAL_COMMUNITY)
Admission: RE | Disposition: A | Discharge status: Home / Self Care | Payer: Self-pay | Source: Home / Self Care | Attending: Internal Medicine

## 2022-12-24 ENCOUNTER — Ambulatory Visit (HOSPITAL_COMMUNITY)
Admission: RE | Admit: 2022-12-24 | Discharge: 2022-12-24 | Disposition: A | Discharge status: Home / Self Care | Payer: Commercial Managed Care - PPO | Attending: Internal Medicine | Admitting: Internal Medicine

## 2022-12-24 DIAGNOSIS — K200 Eosinophilic esophagitis: Secondary | ICD-10-CM | POA: Diagnosis not present

## 2022-12-24 DIAGNOSIS — K3189 Other diseases of stomach and duodenum: Secondary | ICD-10-CM | POA: Insufficient documentation

## 2022-12-24 DIAGNOSIS — K648 Other hemorrhoids: Secondary | ICD-10-CM

## 2022-12-24 DIAGNOSIS — K2281 Esophageal polyp: Secondary | ICD-10-CM

## 2022-12-24 DIAGNOSIS — K317 Polyp of stomach and duodenum: Secondary | ICD-10-CM | POA: Diagnosis not present

## 2022-12-24 DIAGNOSIS — D128 Benign neoplasm of rectum: Secondary | ICD-10-CM | POA: Diagnosis not present

## 2022-12-24 DIAGNOSIS — K219 Gastro-esophageal reflux disease without esophagitis: Secondary | ICD-10-CM

## 2022-12-24 DIAGNOSIS — K2289 Other specified disease of esophagus: Secondary | ICD-10-CM | POA: Insufficient documentation

## 2022-12-24 DIAGNOSIS — J45909 Unspecified asthma, uncomplicated: Secondary | ICD-10-CM | POA: Diagnosis not present

## 2022-12-24 DIAGNOSIS — Z6841 Body Mass Index (BMI) 40.0 and over, adult: Secondary | ICD-10-CM | POA: Insufficient documentation

## 2022-12-24 DIAGNOSIS — K2282 Esophagogastric junction polyp: Secondary | ICD-10-CM | POA: Insufficient documentation

## 2022-12-24 DIAGNOSIS — Z9884 Bariatric surgery status: Secondary | ICD-10-CM | POA: Diagnosis not present

## 2022-12-24 DIAGNOSIS — K625 Hemorrhage of anus and rectum: Secondary | ICD-10-CM | POA: Diagnosis not present

## 2022-12-24 DIAGNOSIS — D120 Benign neoplasm of cecum: Secondary | ICD-10-CM

## 2022-12-24 DIAGNOSIS — R1310 Dysphagia, unspecified: Secondary | ICD-10-CM | POA: Insufficient documentation

## 2022-12-24 DIAGNOSIS — K529 Noninfective gastroenteritis and colitis, unspecified: Secondary | ICD-10-CM | POA: Diagnosis not present

## 2022-12-24 DIAGNOSIS — K2950 Unspecified chronic gastritis without bleeding: Secondary | ICD-10-CM | POA: Insufficient documentation

## 2022-12-24 DIAGNOSIS — C73 Malignant neoplasm of thyroid gland: Secondary | ICD-10-CM

## 2022-12-24 DIAGNOSIS — K602 Anal fissure, unspecified: Secondary | ICD-10-CM | POA: Diagnosis not present

## 2022-12-24 DIAGNOSIS — G4730 Sleep apnea, unspecified: Secondary | ICD-10-CM | POA: Diagnosis not present

## 2022-12-24 DIAGNOSIS — K2210 Ulcer of esophagus without bleeding: Secondary | ICD-10-CM | POA: Diagnosis not present

## 2022-12-24 DIAGNOSIS — R1319 Other dysphagia: Secondary | ICD-10-CM

## 2022-12-24 DIAGNOSIS — E890 Postprocedural hypothyroidism: Secondary | ICD-10-CM | POA: Diagnosis not present

## 2022-12-24 DIAGNOSIS — E039 Hypothyroidism, unspecified: Secondary | ICD-10-CM

## 2022-12-24 DIAGNOSIS — K635 Polyp of colon: Secondary | ICD-10-CM

## 2022-12-24 DIAGNOSIS — G4733 Obstructive sleep apnea (adult) (pediatric): Secondary | ICD-10-CM

## 2022-12-24 HISTORY — PX: POLYPECTOMY: SHX5525

## 2022-12-24 HISTORY — PX: COLONOSCOPY WITH PROPOFOL: SHX5780

## 2022-12-24 HISTORY — PX: ESOPHAGOGASTRODUODENOSCOPY (EGD) WITH PROPOFOL: SHX5813

## 2022-12-24 HISTORY — PX: BIOPSY: SHX5522

## 2022-12-24 SURGERY — COLONOSCOPY WITH PROPOFOL
Anesthesia: Monitor Anesthesia Care

## 2022-12-24 MED ORDER — ONDANSETRON HCL 4 MG/2ML IJ SOLN
INTRAMUSCULAR | Status: DC | PRN
  Administered 2022-12-24: 4 mg via INTRAVENOUS

## 2022-12-24 MED ORDER — DEXMEDETOMIDINE HCL IN NACL 80 MCG/20ML IV SOLN
INTRAVENOUS | Status: AC
  Filled 2022-12-24: qty 20

## 2022-12-24 MED ORDER — NONFORMULARY OR COMPOUNDED ITEM: 2 refills | Status: DC

## 2022-12-24 MED ORDER — SODIUM CHLORIDE 0.9 % IV SOLN: INTRAVENOUS | Status: DC

## 2022-12-24 MED ORDER — PROPOFOL 500 MG/50ML IV EMUL
INTRAVENOUS | Status: DC | PRN
  Administered 2022-12-24: 155 ug/kg/min via INTRAVENOUS
  Administered 2022-12-24: 40 mg via INTRAVENOUS

## 2022-12-24 MED ORDER — DEXMEDETOMIDINE HCL IN NACL 80 MCG/20ML IV SOLN
INTRAVENOUS | Status: DC | PRN
  Administered 2022-12-24 (×2): 4 ug via BUCCAL

## 2022-12-24 MED ORDER — LACTATED RINGERS IV SOLN: INTRAVENOUS | Status: DC

## 2022-12-24 MED ORDER — MIDAZOLAM HCL 2 MG/2ML IJ SOLN
INTRAMUSCULAR | Status: AC
  Filled 2022-12-24: qty 2

## 2022-12-24 MED ORDER — PROPOFOL 500 MG/50ML IV EMUL
INTRAVENOUS | Status: AC
  Filled 2022-12-24: qty 50

## 2022-12-24 MED ORDER — MIDAZOLAM HCL 5 MG/5ML IJ SOLN
INTRAMUSCULAR | Status: DC | PRN
  Administered 2022-12-24: 2 mg via INTRAVENOUS

## 2022-12-24 SURGICAL SUPPLY — 25 items

## 2022-12-24 NOTE — Transfer of Care (Signed)
Immediate Anesthesia Transfer of Care Note  Patient: Tony Pham  Procedure(s) Performed: Procedure(s): COLONOSCOPY WITH PROPOFOL (N/A) ESOPHAGOGASTRODUODENOSCOPY (EGD) WITH PROPOFOL (N/A) SAVORY DILATION (N/A) BIOPSY POLYPECTOMY  Patient Location: PACU  Anesthesia Type:MAC  Level of Consciousness:  sedated, patient cooperative and responds to stimulation  Airway & Oxygen Therapy:Patient Spontanous Breathing and Patient connected to face mask oxgen  Post-op Assessment:  Report given to PACU RN and Post -op Vital signs reviewed and stable  Post vital signs:  Reviewed and stable  Last Vitals:  Vitals:   12/24/22 1050 12/24/22 1100  BP: 119/71 139/88  Pulse: 74 73  Resp: 13 (!) 22  Temp:    SpO2: 94% 94%    Complications: No apparent anesthesia complications

## 2022-12-24 NOTE — Op Note (Signed)
[facility] Patient Name: Tony Pham Procedure Date: 12/24/2022 MRN: [id number] Attending MD: Particia Lather , , [id number] Date of Birth: 1990/10/23 CSN: [id number] Age: 32 Admit Type: Outpatient Procedure:                Colonoscopy Indications:              Rectal bleeding Providers:                Madelyn Brunner" Marisa Severin, RN, [name], Technician Referring MD:             [name] Greggory Stallion MD, MD Medicines:                Monitored Anesthesia Care Complications:            No immediate complications. Estimated Blood Loss:     Estimated blood loss was minimal. Procedure:                Pre-Anesthesia Assessment:                           - Prior to the procedure, a History and Physical                            was performed, and patient medications and                            allergies were reviewed. The patient's tolerance of                            previous anesthesia was also reviewed. The risks                            and benefits of the procedure and the sedation                            options and risks were discussed with the patient.                            All questions were answered, and informed consent                            was obtained. Prior Anticoagulants: The patient has                            taken no anticoagulant or antiplatelet agents. ASA                            Grade Assessment: III - A patient with severe                            systemic disease. After reviewing the risks and  benefits, the patient was deemed in satisfactory                            condition to undergo the procedure.                           After obtaining informed consent, the colonoscope                            was passed under direct vision. Throughout the                            procedure, the patient's blood pressure, pulse, and                             oxygen saturations were monitored continuously. The                            CF-HQ190L ([id number]) Olympus colonoscope was                            introduced through the anus and advanced to the the                            terminal ileum. The colonoscopy was performed                            without difficulty. The patient tolerated the                            procedure well. The quality of the bowel                            preparation was good. The terminal ileum, ileocecal                            valve, appendiceal orifice, and rectum were                            photographed. Scope In: 10:12:21 AM Scope Out: 10:26:51 AM Scope Withdrawal Time: 0 hours 11 minutes 13 seconds  Total Procedure Duration: 0 hours 14 minutes 30 seconds  Findings:      The terminal ileum appeared normal.      Localized inflammation characterized by congestion (edema) and erythema       was found at the appendiceal orifice. Biopsies were taken with a cold       forceps for histology.      A 2 mm polyp was found in the cecum. The polyp was sessile. The polyp       was removed with a cold biopsy forceps. Resection and retrieval were       complete.      A 3 mm polyp was found in the rectum. The polyp was sessile. The polyp       was removed with a cold snare. Resection and retrieval were complete.  Non-bleeding internal hemorrhoids were found during retroflexion. The       hemorrhoids were small.      An anal fissure was found on perianal exam. Impression:               - The examined portion of the ileum was normal.                           - Localized inflammation was found at the                            appendiceal orifice. Biopsied.                           - One 2 mm polyp in the cecum, removed with a cold                            biopsy forceps. Resected and retrieved.                           - One 3 mm polyp in the rectum, removed with a cold                             snare. Resected and retrieved.                           - Non-bleeding internal hemorrhoids.                           - Anal fissure found on perianal exam. Moderate Sedation:      Not Applicable - Patient had care per Anesthesia. Recommendation:           - Discharge patient to home (with escort).                           - Will prescribe diltiazem 2% and lidocaine 5%                            cream. Please use this three times daily for 8                            weeks. Please pick this up at [facility].                           - Await pathology results.                           - Return to GI clinic in 2 months.                           - The findings and recommendations were discussed                            with the patient. Procedure Code(s):        --- Professional ---  [id number], Colonoscopy, flexible; with removal of                            tumor(s), polyp(s), or other lesion(s) by snare                            technique                           45380, 59, Colonoscopy, flexible; with biopsy,                            single or multiple Diagnosis Code(s):        --- Professional ---                           K64.8, Other hemorrhoids                           K52.9, Noninfective gastroenteritis and colitis,                            unspecified                           D12.0, Benign neoplasm of cecum                           D12.8, Benign neoplasm of rectum                           K60.2, Anal fissure, unspecified                           K62.5, Hemorrhage of anus and rectum CPT copyright 2022 American Medical Association. All rights reserved. The codes documented in this report are preliminary and upon coder review may  be revised to meet current compliance requirements. Dr Particia Latherlaire Atsushi Yom "Alan RipperClaire" Leonides SchanzDorsey,  12/24/2022 11:00:41 AM Number of Addenda: 0

## 2022-12-24 NOTE — Op Note (Signed)
[facility] Patient Name: Tony GaviaWilliam Pham Procedure Date: 12/24/2022 MRN: [id number] Attending MD: Particia LatherYing "Claire" Jaydyn Pham , , [phone] Date of Birth: 1991/06/19 CSN: [id number] Age: [age] Admit Type: Outpatient Procedure:                Upper GI endoscopy Indications:              Dysphagia, Heartburn, history of gastric sleeve Providers:                Tony BrunnerYing "Claire" Tony Pham, Tony Frierson, RN, Tony Pham                            Pham, Technician Referring MD:             Tony CrosbySionne A. Greggory StallionGeorge MD, MD Medicines:                Monitored Anesthesia Care Complications:            No immediate complications. Estimated Blood Loss:     Estimated blood loss was minimal. Procedure:                Pre-Anesthesia Assessment:                           - Prior to the procedure, a History and Physical                            was performed, and patient medications and                            allergies were reviewed. The patient's tolerance of                            previous anesthesia was also reviewed. The risks                            and benefits of the procedure and the sedation                            options and risks were discussed with the patient.                            All questions were answered, and informed consent                            was obtained. Prior Anticoagulants: The patient has                            taken no anticoagulant or antiplatelet agents. ASA                            Grade Assessment: III - A patient with severe                            systemic disease. After reviewing the risks and  benefits, the patient was deemed in satisfactory                            condition to undergo the procedure.                           After obtaining informed consent, the endoscope was                            passed under direct vision. Throughout the                            procedure, the patient's blood  pressure, pulse, and                            oxygen saturations were monitored continuously. The                            [id number]-H190 ([id number]) Olympus endoscope was introduced                            through the mouth, and advanced to the second part                            of duodenum. The upper GI endoscopy was                            accomplished without difficulty. The patient                            tolerated the procedure well. Scope In: Scope Out: Findings:      Mucosal variance characterized by furrows was found in the entire       esophagus. Biopsies were taken from the proximal and distal esophagus       with a cold forceps for histology to rule out eosinophilic esophagitis.      Two 7-10 mm polyps with no bleeding were found at the gastroesophageal       junction. These polyps were removed with a cold snare. Resection and       retrieval were complete.      One 5 mm sessile polyp with no bleeding and no stigmata of recent       bleeding was found in the gastric fundus. The polyp was removed with a       cold snare. Resection and retrieval were complete.      Evidence of a sleeve gastrectomy was found in the gastric body. This was       characterized by healthy appearing mucosa.      Localized mildly erythematous mucosa without bleeding was found in the       gastric antrum. Biopsies were taken with a cold forceps for histology.      The examined duodenum was normal. Impression:               - Esophageal furrows. Biopsied.                           -  Gastroesophageal junction polyp(s) were found.                            Resected and retrieved.                           - One gastric polyp. Resected and retrieved.                           - A sleeve gastrectomy was found, characterized by                            healthy appearing mucosa.                           - Erythematous mucosa in the antrum. Biopsied.                           - Normal examined  duodenum. Moderate Sedation:      Not Applicable - Patient had care per Anesthesia. Recommendation:           - Await pathology results.                           - Perform a colonoscopy today. Procedure Code(s):        --- Professional ---                           [phone], Esophagogastroduodenoscopy, flexible,                            transoral; with removal of tumor(s), polyp(s), or                            other lesion(s) by snare technique                           43239, 59, Esophagogastroduodenoscopy, flexible,                            transoral; with biopsy, single or multiple Diagnosis Code(s):        --- Professional ---                           K22.89, Other specified disease of esophagus                           K22.82, Esophagogastric junction polyp                           K31.7, Polyp of stomach and duodenum                           Z98.84, Bariatric surgery status                           K31.89, Other diseases of stomach and duodenum  R13.10, Dysphagia, unspecified                           R12, Heartburn CPT copyright 2022 American Medical Association. All rights reserved. The codes documented in this report are preliminary and upon coder review may  be revised to meet current compliance requirements. Dr Particia Lather "Alan Ripper" Leonides Schanz,  12/24/2022 10:48:06 AM Number of Addenda: 0

## 2022-12-24 NOTE — Progress Notes (Signed)
GASTROENTEROLOGY PROCEDURE H&P NOTE   Primary Care Physician: Rayetta Humphrey, MD    Reason for Procedure:   Dysphagia, GERD, history of gastric sleeve, rectal bleeding  Plan:    EGD/colonoscopy  Patient is appropriate for endoscopic procedure(s) in the hospital setting.  The nature of the procedure, as well as the risks, benefits, and alternatives were carefully and thoroughly reviewed with the patient. Ample time for discussion and questions allowed. The patient understood, was satisfied, and agreed to proceed.     HPI: Tony Pham is a 32 y.o. male who presents for EGD/colonoscopy for evaluation of dysphagia, GERD, history of gastric sleeve, rectal bleeding .  Patient was most recently seen in the Gastroenterology Clinic on 11/02/22.  No interval change in medical history since that appointment. Please refer to that note for full details regarding GI history and clinical presentation.   Past Medical History:  Diagnosis Date   Arthritis    Asthma    Depression    Obesity    Seasonal allergies    Sleep apnea    Thyroid cancer    Thyroid disease     Past Surgical History:  Procedure Laterality Date   THYROIDECTOMY      Prior to Admission medications   Medication Sig Start Date End Date Taking? Authorizing Provider  Ascorbic Acid (VITAMIN C) 1000 MG tablet Take 1,000 mg by mouth daily.   Yes [provider]  b complex vitamins capsule Take 1 capsule by mouth daily.   Yes [provider]  fluticasone (FLONASE) 50 MCG/ACT nasal spray Place 1 spray into both nostrils daily. 01/02/21  Yes [provider]  levothyroxine (SYNTHROID) 137 MCG tablet Take 274 mcg by mouth daily before breakfast.   Yes [provider]  loratadine (CLARITIN) 10 MG tablet Take 10 mg by mouth daily.   Yes [provider]  meloxicam (MOBIC) 15 MG tablet Take 15 mg by mouth daily. 12/17/22  Yes [provider]  montelukast (SINGULAIR) 10 MG  tablet Take 1 tablet by mouth daily as needed (allergies). 05/06/20  Yes [provider]  Multiple Vitamin (MULTIVITAMIN) capsule Take 1 capsule by mouth daily.   Yes [provider]  pantoprazole (PROTONIX) 40 MG tablet Take 1 tablet (40 mg total) by mouth daily. 11/02/22  Yes Quentin Mulling R, PA-C  sertraline (ZOLOFT) 50 MG tablet Take 50 mg by mouth daily. 12/16/22  Yes [provider]  Vitamin D, Cholecalciferol, 25 MCG (1000 UT) TABS Take 1,000 Units by mouth daily.   Yes [provider]  albuterol (PROVENTIL) (2.5 MG/3ML) 0.083% nebulizer solution Take 3 mLs (2.5 mg total) by nebulization every 6 (six) hours as needed for wheezing or shortness of breath. 11/03/21   Bing Neighbors, NP  albuterol (VENTOLIN HFA) 108 (90 Base) MCG/ACT inhaler Inhale 2 puffs into the lungs every 6 (six) hours as needed for wheezing or shortness of breath.    [provider]    Current Facility-Administered Medications  Medication Dose Route Frequency Provider Last Rate Last Admin   0.9 %  sodium chloride infusion   Intravenous Continuous Quentin Mulling R, PA-C       lactated ringers infusion   Intravenous Continuous Imogene Burn, MD 10 mL/hr at 12/24/22 0835 New Bag at 12/24/22 0835    Allergies as of 11/02/2022   (No Known Allergies)    Family History  Problem Relation Age of Onset   Schizophrenia Sister    Depression Sister  Social History   Socioeconomic History   Marital status: Married    Spouse name: Not on file   Number of children: Not on file   Years of education: Not on file   Highest education level: Not on file  Occupational History   Not on file  Tobacco Use   Smoking status: Never   Smokeless tobacco: Never  Vaping Use   Vaping Use: Never used  Substance and Sexual Activity   Alcohol use: No   Drug use: No   Sexual activity: Not Currently  Other Topics Concern   Not on file  Social History Narrative   Not on file    Social Determinants of Health   Financial Resource Strain: Not on file  Food Insecurity: Not on file  Transportation Needs: Not on file  Physical Activity: Not on file  Stress: Not on file  Social Connections: Not on file  Intimate Partner Violence: Not on file    Physical Exam: Vital signs in last 24 hours: Pulse 90   Temp 98 F (36.7 C) (Tympanic)   Ht 5\' 9"  (1.753 m)   Wt (!) 175.1 kg   SpO2 100%   BMI 57.00 kg/m  GEN: NAD EYE: Sclerae anicteric ENT: MMM CV: Non-tachycardic Pulm: No increased WOB GI: Soft NEURO:  Alert & Oriented   Eulah Pont, MD [facility]   12/24/2022 8:37 AM

## 2022-12-24 NOTE — Anesthesia Postprocedure Evaluation (Signed)
Anesthesia Post Note  Patient: Tony Pham  Procedure(s) Performed: COLONOSCOPY WITH PROPOFOL ESOPHAGOGASTRODUODENOSCOPY (EGD) WITH PROPOFOL SAVORY DILATION BIOPSY POLYPECTOMY     Patient location during evaluation: PACU Anesthesia Type: MAC Level of consciousness: awake Pain management: pain level controlled Vital Signs Assessment: post-procedure vital signs reviewed and stable Respiratory status: spontaneous breathing, nonlabored ventilation and respiratory function stable Cardiovascular status: stable and blood pressure returned to baseline Postop Assessment: no apparent nausea or vomiting Anesthetic complications: no   No notable events documented.  Last Vitals:  Vitals:   12/24/22 1050 12/24/22 1100  BP: 119/71 139/88  Pulse: 74 73  Resp: 13 (!) 22  Temp:    SpO2: 94% 94%    Last Pain:  Vitals:   12/24/22 1100  TempSrc:   PainSc: 0-No pain                 Linton Rump

## 2022-12-24 NOTE — Discharge Instructions (Signed)
YOU HAD AN ENDOSCOPIC PROCEDURE TODAY: Refer to the procedure report and other information in the discharge instructions given to you for any specific questions about what was found during the examination. If this information does not answer your questions, please call [place] office at [phone] to clarify.  ° °YOU SHOULD EXPECT: Some feelings of bloating in the abdomen. Passage of more gas than usual. Walking can help get rid of the air that was put into your GI tract during the procedure and reduce the bloating. If you had a lower endoscopy (such as a colonoscopy or flexible sigmoidoscopy) you may notice spotting of blood in your stool or on the toilet paper. Some abdominal soreness may be present for a day or two, also. ° °DIET: Your first meal following the procedure should be a light meal and then it is ok to progress to your normal diet. A half-sandwich or bowl of soup is an example of a good first meal. Heavy or fried foods are harder to digest and may make you feel nauseous or bloated. Drink plenty of fluids but you should avoid alcoholic beverages for 24 hours. If you had a esophageal dilation, please see attached instructions for diet.   ° °ACTIVITY: Your care partner should take you home directly after the procedure. You should plan to take it easy, moving slowly for the rest of the day. You can resume normal activity the day after the procedure however YOU SHOULD NOT DRIVE, use power tools, machinery or perform tasks that involve climbing or major physical exertion for 24 hours (because of the sedation medicines used during the test).  ° °SYMPTOMS TO REPORT IMMEDIATELY: °A gastroenterologist can be reached at any hour. Please call [phone]  for any of the following symptoms:  °Following lower endoscopy (colonoscopy, flexible sigmoidoscopy) °Excessive amounts of blood in the stool  °Significant tenderness, worsening of abdominal pains  °Swelling of the abdomen that is new, acute  °Fever of 100° or  higher  °Following upper endoscopy (EGD, EUS, ERCP, esophageal dilation) °Vomiting of blood or coffee ground material  °New, significant abdominal pain  °New, significant chest pain or pain under the shoulder blades  °Painful or persistently difficult swallowing  °New shortness of breath  °Black, tarry-looking or red, bloody stools ° °FOLLOW UP:  °If any biopsies were taken you will be contacted by phone or by letter within the next 1-3 weeks. Call [phone]  if you have not heard about the biopsies in 3 weeks.  °Please also call with any specific questions about appointments or follow up tests. ° °

## 2022-12-25 ENCOUNTER — Encounter: Payer: Self-pay | Admitting: Internal Medicine

## 2022-12-25 ENCOUNTER — Telehealth: Payer: Self-pay | Admitting: Internal Medicine

## 2022-12-25 LAB — SURGICAL PATHOLOGY

## 2022-12-25 NOTE — Progress Notes (Signed)
Hi Beth, please let the patient know that he has a diagnosis of eosinophilic esophagitis (details in the letter that I sent to him). This is an autoimmune condition that can lead to trouble swallowing. Please have him increase his pantoprazole 40 mg to twice daily. We will plan for a repeat EGD in 2 months to see if the PPI therapy is sufficient to keep his condition under control.

## 2022-12-25 NOTE — Telephone Encounter (Signed)
Patient called stated he had a double procedure yesterday and he is wanting to ask about his carotid artery feeling tender. Please advise.

## 2022-12-25 NOTE — Telephone Encounter (Signed)
Called the patient and let him know that sometimes we support his airway during the procedure by helping to hold the sides of his neck. This can result in soreness on occasion. The soreness should get better over time. Patient appreciated the phone call.

## 2022-12-25 NOTE — Telephone Encounter (Signed)
Spoke with the patient. He reports discomfort and "soreness" on the sides of his neck. He explains the soreness in in the "area where you would take a pulse on my neck" both sides. Confirms he can swallow without difficulty. No cough or vomiting.

## 2022-12-26 ENCOUNTER — Encounter (HOSPITAL_COMMUNITY): Payer: Self-pay | Admitting: Internal Medicine

## 2022-12-27 ENCOUNTER — Other Ambulatory Visit: Payer: Self-pay

## 2022-12-27 MED ORDER — PANTOPRAZOLE SODIUM 40 MG PO TBEC: 40.0000 mg | DELAYED_RELEASE_TABLET | Freq: Two times a day (BID) | ORAL | 0 refills | Status: DC

## 2022-12-27 MED ORDER — NONFORMULARY OR COMPOUNDED ITEM: 2 refills | Status: DC

## 2023-01-19 ENCOUNTER — Ambulatory Visit
Admission: EM | Admit: 2023-01-19 | Discharge: 2023-01-19 | Disposition: A | Discharge status: Home / Self Care | Payer: Commercial Managed Care - PPO | Attending: Urgent Care | Admitting: Urgent Care

## 2023-01-19 DIAGNOSIS — H6593 Unspecified nonsuppurative otitis media, bilateral: Secondary | ICD-10-CM

## 2023-01-19 DIAGNOSIS — J039 Acute tonsillitis, unspecified: Secondary | ICD-10-CM | POA: Diagnosis not present

## 2023-01-19 DIAGNOSIS — K200 Eosinophilic esophagitis: Secondary | ICD-10-CM

## 2023-01-19 DIAGNOSIS — J309 Allergic rhinitis, unspecified: Secondary | ICD-10-CM | POA: Diagnosis not present

## 2023-01-19 LAB — POCT RAPID STREP A (OFFICE): Rapid Strep A Screen: NEGATIVE

## 2023-01-19 MED ORDER — DEXAMETHASONE 6 MG PO TABS: 6.0000 mg | ORAL_TABLET | Freq: Every day | ORAL | 0 refills | Status: AC

## 2023-01-19 MED ORDER — AZITHROMYCIN 250 MG PO TABS: 250.0000 mg | ORAL_TABLET | Freq: Every day | ORAL | 0 refills | Status: DC

## 2023-01-19 MED ORDER — QNASL 80 MCG/ACT NA AERS: 2.0000 | INHALATION_SPRAY | Freq: Every day | NASAL | 0 refills | Status: DC

## 2023-01-19 NOTE — ED Triage Notes (Signed)
Patient to Urgent Care with complaints of nasal congestion/ ear pain/ bilateral ear fullness/ hoarseness. Denies any fever.  Course of Augmentin that was prescribed 4/20. Reports cough didn't ever resolve and symptoms including sore throat/ ear fullness have returned.   Reports symptoms worsened two days ago. Taking cough syrup/ mucinex-DM/ Singulair and Claritin/ Flonase/

## 2023-01-19 NOTE — Discharge Instructions (Signed)
Please take the azithromycin for tonsillitis.  Your strep test is negative. Please take Decadron, 1 tablet daily for the next 6 days.  Take this with breakfast If your insurance will cover Qnasl, please use this instead of Flonase for congestion.  Please discuss the use of fluticasone inhaler with your GI specialist to help with your eosinophilic esophagitis. This may also help your throat symptoms.

## 2023-01-19 NOTE — ED Provider Notes (Signed)
Renaldo Fiddler    CSN: [id number] Arrival date & time: 01/19/23  7829      History   Chief Complaint Chief Complaint  Patient presents with   Nasal Congestion   Otalgia   Hoarse    HPI Tony Pham is a 32 y.o. male.   32 year old male presents today due to concerns of nasal congestion, ear pain, and hoarseness.  He was recently diagnosed with a sinus infection and prescribed Augmentin.  He took this twice a day for 7 days.  He states that his symptoms seem to improve, but never cleared up completely.  He does have a known history of allergies and asthma.  He was also just recently diagnosed with eosinophilic esophagitis.  He is having some discomfort to his throat, worse over the past two days.  He remains on antihistamines, Flonase, and Singulair daily.  He has not stopped taking these.  His cough is primarily dry, no significant mucus production.  Denies chest pain, palpitations, significant shortness of breath or wheezing.  He denies a fever. Has used his albuterol with some improvement to cough.   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/02/2014   Vitamin D deficiency 01/14/2014   Obstructive sleep apnea (adult) (pediatric) 09/24/2013   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   Thyroid carcinoma (HCC) 06/30/2012   Mild intermittent asthma without complication 11/08/2011    Past  Surgical History:  Procedure Laterality Date   BIOPSY  12/24/2022   Procedure: BIOPSY;  Surgeon: Imogene Burn, MD;  Location: [facility];  Service: Gastroenterology;;   COLONOSCOPY WITH PROPOFOL N/A 12/24/2022   Procedure: COLONOSCOPY WITH PROPOFOL;  Surgeon: Imogene Burn, MD;  Location: [facility];  Service: Gastroenterology;  Laterality: N/A;   ESOPHAGOGASTRODUODENOSCOPY (EGD) WITH PROPOFOL N/A 12/24/2022   Procedure: ESOPHAGOGASTRODUODENOSCOPY (EGD) WITH PROPOFOL;  Surgeon: Imogene Burn, MD;  Location: [facility];  Service: Gastroenterology;  Laterality: N/A;   POLYPECTOMY  12/24/2022   Procedure: POLYPECTOMY;  Surgeon: Imogene Burn, MD;  Location: [facility];  Service: Gastroenterology;;   THYROIDECTOMY         Home Medications    Prior to Admission medications   Medication Sig Start Date End Date Taking? Authorizing Provider  azithromycin (ZITHROMAX) 250 MG tablet Take 1 tablet (250 mg total) by mouth daily. Take first 2 tablets together, then 1 every day until finished. 01/19/23  Yes Lanecia Sliva L, PA  Beclomethasone Dipropionate (QNASL) 80 MCG/ACT AERS Place 2 sprays into the nose daily. 01/19/23  Yes Dary Dilauro L, PA  dexamethasone (DECADRON) 6 MG tablet Take 1 tablet (6 mg total) by mouth daily for 6 days. 01/19/23 01/25/23 Yes Nickalas Mccarrick L, PA  albuterol (PROVENTIL) (2.5 MG/3ML) 0.083% nebulizer solution Take 3 mLs (2.5 mg total) by nebulization every 6 (six) hours as needed for wheezing or  shortness of breath. 11/03/21   Bing Neighbors, NP  albuterol (VENTOLIN HFA) 108 (90 Base) MCG/ACT inhaler Inhale 2 puffs into the lungs every 6 (six) hours as needed for wheezing or shortness of breath.    [provider]  Ascorbic Acid (VITAMIN C) 1000 MG tablet Take 1,000 mg by mouth daily.    [provider]  b complex vitamins capsule Take 1 capsule by mouth daily.    [provider]  levothyroxine (SYNTHROID) 137 MCG tablet Take 274 mcg by mouth  daily before breakfast.    [provider]  loratadine (CLARITIN) 10 MG tablet Take 10 mg by mouth daily.    [provider]  meloxicam (MOBIC) 15 MG tablet Take 15 mg by mouth daily. 12/17/22   [provider]  montelukast (SINGULAIR) 10 MG tablet Take 1 tablet by mouth daily as needed (allergies). 05/06/20   [provider]  Multiple Vitamin (MULTIVITAMIN) capsule Take 1 capsule by mouth daily.    [provider]  NONFORMULARY OR COMPOUNDED ITEM Diltiazem 2 % lidocaine 5% ointment using index finger apply a small amout of medicationinside the rectum up to your first knuckle 3 times daily for 8 weeks 12/27/22   Imogene Burn, MD  pantoprazole (PROTONIX) 40 MG tablet Take 1 tablet (40 mg total) by mouth 2 (two) times daily before a meal. 12/27/22 03/27/23  Imogene Burn, MD  sertraline (ZOLOFT) 50 MG tablet Take 50 mg by mouth daily. 12/16/22   [provider]  Vitamin D, Cholecalciferol, 25 MCG (1000 UT) TABS Take 1,000 Units by mouth daily.    [provider]    Family History Family History  Problem Relation Age of Onset   Schizophrenia Sister    Depression Sister     Social History Social History   Tobacco Use   Smoking status: Never   Smokeless tobacco: Never  Vaping Use   Vaping Use: Never used  Substance Use Topics   Alcohol use: No   Drug use: No     Allergies   Patient has no known allergies.   Review of Systems Review of Systems  HENT:  Positive for ear pain.   As per HPI   Physical Exam Triage Vital Signs ED Triage Vitals  Enc Vitals Group     BP 01/19/23 1025 (!) 153/75     Pulse Rate 01/19/23 1025 69     Resp 01/19/23 1025 18     Temp 01/19/23 1025 98.2 F (36.8 C)     Temp src --      SpO2 01/19/23 1025 97 %     Weight --      Height --      Head Circumference --      Peak Flow --      Pain Score 01/19/23 1033 1     Pain Loc --      Pain Edu? --      Excl. in GC? --    No data  found.  Updated Vital Signs BP (!) 153/75   Pulse 69   Temp 98.2 F (36.8 C)   Resp 18   SpO2 97%   Visual Acuity Right Eye Distance:   Left Eye Distance:   Bilateral Distance:    Right Eye Near:   Left Eye Near:    Bilateral Near:     Physical Exam Vitals and nursing note reviewed.  Constitutional:      General: He is not in  acute distress.    Appearance: Normal appearance. He is obese. He is not ill-appearing, toxic-appearing or diaphoretic.  HENT:     Head: Normocephalic and atraumatic.     Right Ear: Ear canal and external ear normal. A middle ear effusion is present. There is no impacted cerumen. Tympanic membrane is injected. Tympanic membrane is not bulging.     Left Ear: Ear canal and external ear normal. A middle ear effusion is present. There is no impacted cerumen. Tympanic membrane is injected. Tympanic membrane is not bulging.     Nose: Rhinorrhea present. No congestion.     Mouth/Throat:     Lips: Pink.     Mouth: Mucous membranes are moist.     Pharynx: Oropharynx is clear. Uvula midline. Posterior oropharyngeal erythema present. No pharyngeal swelling, oropharyngeal exudate or uvula swelling.     Tonsils: Tonsillar exudate present. 3+ on the right. 2+ on the left.  Cardiovascular:     Rate and Rhythm: Normal rate and regular rhythm.     Pulses: Normal pulses.  Pulmonary:     Effort: Pulmonary effort is normal. No respiratory distress.     Breath sounds: Normal breath sounds. No stridor. No wheezing, rhonchi or rales.  Musculoskeletal:     Cervical back: Normal range of motion and neck supple. No rigidity or tenderness.  Lymphadenopathy:     Cervical: Cervical adenopathy present.  Skin:    General: Skin is warm and dry.     Capillary Refill: Capillary refill takes less than 2 seconds.     Findings: No erythema or rash.  Neurological:     General: No focal deficit present.     Mental Status: He is alert and oriented to person, place, and time.       UC Treatments / Results  Labs (all labs ordered are listed, but only abnormal results are displayed) Labs Reviewed  POCT RAPID STREP A (OFFICE)    EKG   Radiology No results found.  Procedures Procedures (including critical care time)  Medications Ordered in UC Medications - No data to display  Initial Impression / Assessment and Plan / UC Course  I have reviewed the triage vital signs and the nursing notes.  Pertinent labs & imaging results that were available during my care of the patient were reviewed by me and considered in my medical decision making (see chart for details).     Allergic rhinitis - continue home antihistamines. If insurance will pay for it, stop flonase and switch to Qnasl.  Eosinophilic esophagitis - newly on PPI. Discuss Advair with GI specialist. Otitis media with effusion - add dexamethasone, side effect profile reviewed Acute tonsillitis - strep test negative. Will start azithromycin as pt failed to respond to augmentin.   Final Clinical Impressions(s) / UC Diagnoses   Final diagnoses:  Allergic rhinitis, unspecified seasonality, unspecified trigger  Eosinophilic esophagitis  Otitis media with effusion, bilateral  Acute tonsillitis, unspecified etiology     Discharge Instructions      Please take the azithromycin for tonsillitis.  Your strep test is negative. Please take Decadron, 1 tablet daily for the next 6 days.  Take this with breakfast If your insurance will cover Qnasl, please use this instead of Flonase for congestion.  Please discuss the use of fluticasone inhaler with your GI specialist to help with your eosinophilic esophagitis. This may also help your throat symptoms.     ED Prescriptions     Medication Sig Dispense Auth. Provider   Beclomethasone  Dipropionate (QNASL) 80 MCG/ACT AERS Place 2 sprays into the nose daily. 10.6 g Maryah Marinaro L, PA   azithromycin (ZITHROMAX) 250 MG tablet Take 1 tablet (250 mg  total) by mouth daily. Take first 2 tablets together, then 1 every day until finished. 6 tablet Mallie Linnemann L, PA   dexamethasone (DECADRON) 6 MG tablet Take 1 tablet (6 mg total) by mouth daily for 6 days. 6 tablet Jeanenne Licea L, [place]      PDMP not reviewed this encounter.   Maretta Bees, [place] 01/19/23 [phone]

## 2023-01-30 ENCOUNTER — Telehealth: Payer: Self-pay | Admitting: *Deleted

## 2023-01-30 NOTE — Telephone Encounter (Signed)
Yesi,  This pt's BMI is greater than 50; their procedure will need to be performed at the hospital.  Thanks,  Luciano Cinquemani 

## 2023-01-31 ENCOUNTER — Other Ambulatory Visit: Payer: Self-pay

## 2023-01-31 ENCOUNTER — Telehealth: Payer: Self-pay

## 2023-01-31 DIAGNOSIS — R1319 Other dysphagia: Secondary | ICD-10-CM

## 2023-01-31 DIAGNOSIS — K200 Eosinophilic esophagitis: Secondary | ICD-10-CM

## 2023-01-31 NOTE — Telephone Encounter (Signed)
EGD due in June 2024 for follow up to evaluate EOE treatment effectiveness on PPI.  Patient declines appointment 03/14/23. His employer penalizes all absences from work. Patient's scheduled day off is Monday.  Scheduled for follow up EGD 04/08/23. Written instructions mailed to the patient. Questions invited. No questions at this time.

## 2023-02-04 ENCOUNTER — Ambulatory Visit (AMBULATORY_SURGERY_CENTER): Payer: Commercial Managed Care - PPO | Admitting: *Deleted

## 2023-02-04 VITALS — Ht 69.0 in | Wt 380.0 lb

## 2023-02-04 DIAGNOSIS — K200 Eosinophilic esophagitis: Secondary | ICD-10-CM

## 2023-02-04 DIAGNOSIS — K2100 Gastro-esophageal reflux disease with esophagitis, without bleeding: Secondary | ICD-10-CM

## 2023-02-04 NOTE — Progress Notes (Signed)
Pre visit conducted over telephone.  Instructions forwarded through [facility].   No egg or soy allergy known to patient  No issues known to pt with past sedation with any surgeries or procedures Patient denies ever being told they had issues or difficulty with intubation  No FH of Malignant Hyperthermia Pt is not on diet pills Pt is not on  home 02  Pt is not on blood thinners  Pt denies issues with constipation  No A fib or A flutter Have any cardiac testing pending-- NO Pt instructed to use Single[employer] or GoodRx for a price reduction on prep    Patient instructions to hold MELOXICAM 1 week before procedure. Also to notify us with any changes in health or medications.

## 2023-02-18 ENCOUNTER — Encounter: Payer: Commercial Managed Care - PPO | Admitting: Internal Medicine

## 2023-03-13 ENCOUNTER — Encounter: Payer: Self-pay | Admitting: Internal Medicine

## 2023-03-28 ENCOUNTER — Encounter (HOSPITAL_COMMUNITY): Payer: Self-pay | Admitting: Internal Medicine

## 2023-04-06 NOTE — Anesthesia Preprocedure Evaluation (Signed)
Anesthesia Evaluation  Patient identified by MRN, date of birth, ID band Patient awake    Reviewed: Allergy & Precautions, NPO status , Patient's Chart, lab work & pertinent test results  Airway Mallampati: I  TM Distance: >3 FB Neck ROM: Full    Dental  (+) Teeth Intact, Dental Advisory Given   Pulmonary asthma (well controlled, hasnt used inhaler in months) , sleep apnea and Continuous Positive Airway Pressure Ventilation    Pulmonary exam normal breath sounds clear to auscultation       Cardiovascular negative cardio ROS Normal cardiovascular exam Rhythm:Regular Rate:Normal     Neuro/Psych  PSYCHIATRIC DISORDERS  Depression    negative neurological ROS     GI/Hepatic Neg liver ROS,GERD  Medicated and Controlled,,Dysphagia    Endo/Other  Hypothyroidism  Morbid obesityBMI 56  Renal/GU negative Renal ROS  negative genitourinary   Musculoskeletal  (+) Arthritis , Osteoarthritis,    Abdominal  (+) + obese  Peds  Hematology negative hematology ROS (+)   Anesthesia Other Findings   Reproductive/Obstetrics negative OB ROS                              Anesthesia Physical Anesthesia Plan  ASA: 4  Anesthesia Plan: MAC   Post-op Pain Management:    Induction:   PONV Risk Score and Plan: 2 and Propofol infusion and TIVA  Airway Management Planned: Natural Airway and Nasal Cannula  Additional Equipment: None  Intra-op Plan:   Post-operative Plan:   Informed Consent:   Plan Discussed with:   Anesthesia Plan Comments: (Super morbid obesity- HFNC)       Anesthesia Quick Evaluation

## 2023-04-08 ENCOUNTER — Encounter (HOSPITAL_COMMUNITY)
Admission: RE | Disposition: A | Discharge status: Home / Self Care | Payer: Self-pay | Source: Home / Self Care | Attending: Internal Medicine

## 2023-04-08 ENCOUNTER — Ambulatory Visit (HOSPITAL_COMMUNITY): Payer: Commercial Managed Care - PPO | Admitting: Anesthesiology

## 2023-04-08 ENCOUNTER — Ambulatory Visit (HOSPITAL_BASED_OUTPATIENT_CLINIC_OR_DEPARTMENT_OTHER): Payer: Commercial Managed Care - PPO | Admitting: Anesthesiology

## 2023-04-08 ENCOUNTER — Ambulatory Visit (HOSPITAL_COMMUNITY)
Admission: RE | Admit: 2023-04-08 | Discharge: 2023-04-08 | Disposition: A | Discharge status: Home / Self Care | Payer: Commercial Managed Care - PPO | Attending: Internal Medicine | Admitting: Internal Medicine

## 2023-04-08 ENCOUNTER — Other Ambulatory Visit: Payer: Self-pay

## 2023-04-08 DIAGNOSIS — J4520 Mild intermittent asthma, uncomplicated: Secondary | ICD-10-CM | POA: Diagnosis not present

## 2023-04-08 DIAGNOSIS — Z09 Encounter for follow-up examination after completed treatment for conditions other than malignant neoplasm: Secondary | ICD-10-CM | POA: Diagnosis not present

## 2023-04-08 DIAGNOSIS — K319 Disease of stomach and duodenum, unspecified: Secondary | ICD-10-CM | POA: Diagnosis not present

## 2023-04-08 DIAGNOSIS — K31A0 Gastric intestinal metaplasia, unspecified: Secondary | ICD-10-CM | POA: Diagnosis not present

## 2023-04-08 DIAGNOSIS — Z9884 Bariatric surgery status: Secondary | ICD-10-CM | POA: Insufficient documentation

## 2023-04-08 DIAGNOSIS — K200 Eosinophilic esophagitis: Secondary | ICD-10-CM | POA: Diagnosis not present

## 2023-04-08 DIAGNOSIS — K31A11 Gastric intestinal metaplasia without dysplasia, involving the antrum: Secondary | ICD-10-CM | POA: Insufficient documentation

## 2023-04-08 DIAGNOSIS — K2950 Unspecified chronic gastritis without bleeding: Secondary | ICD-10-CM | POA: Insufficient documentation

## 2023-04-08 DIAGNOSIS — Z6841 Body Mass Index (BMI) 40.0 and over, adult: Secondary | ICD-10-CM | POA: Diagnosis not present

## 2023-04-08 DIAGNOSIS — R1319 Other dysphagia: Secondary | ICD-10-CM

## 2023-04-08 HISTORY — PX: ESOPHAGOGASTRODUODENOSCOPY (EGD) WITH PROPOFOL: SHX5813

## 2023-04-08 HISTORY — PX: BIOPSY: SHX5522

## 2023-04-08 SURGERY — ESOPHAGOGASTRODUODENOSCOPY (EGD) WITH PROPOFOL
Anesthesia: Monitor Anesthesia Care

## 2023-04-08 MED ORDER — LACTATED RINGERS IV SOLN: INTRAVENOUS | Status: DC

## 2023-04-08 MED ORDER — DEXMEDETOMIDINE HCL IN NACL 80 MCG/20ML IV SOLN
INTRAVENOUS | Status: DC | PRN
  Administered 2023-04-08: 20 ug via INTRAVENOUS

## 2023-04-08 MED ORDER — SODIUM CHLORIDE 0.9 % IV SOLN: INTRAVENOUS | Status: DC

## 2023-04-08 MED ORDER — PROPOFOL 500 MG/50ML IV EMUL
INTRAVENOUS | Status: DC | PRN
  Administered 2023-04-08: 20 mg via INTRAVENOUS
  Administered 2023-04-08: 30 mg via INTRAVENOUS
  Administered 2023-04-08: 130 ug/kg/min via INTRAVENOUS
  Administered 2023-04-08: 30 mg via INTRAVENOUS
  Administered 2023-04-08 (×3): 20 mg via INTRAVENOUS

## 2023-04-08 MED ORDER — DEXMEDETOMIDINE HCL IN NACL 80 MCG/20ML IV SOLN
INTRAVENOUS | Status: AC
  Filled 2023-04-08: qty 20

## 2023-04-08 MED ORDER — PROPOFOL 1000 MG/100ML IV EMUL
INTRAVENOUS | Status: AC
  Filled 2023-04-08: qty 100

## 2023-04-08 MED ORDER — LIDOCAINE 2% (20 MG/ML) 5 ML SYRINGE
INTRAMUSCULAR | Status: DC | PRN
  Administered 2023-04-08: 50 mg via INTRAVENOUS

## 2023-04-08 SURGICAL SUPPLY — 15 items

## 2023-04-08 NOTE — Discharge Instructions (Signed)
YOU HAD AN ENDOSCOPIC PROCEDURE TODAY: Refer to the procedure report and other information in the discharge instructions given to you for any specific questions about what was found during the examination. If this information does not answer your questions, please call [place] office at [phone] to clarify.   YOU SHOULD EXPECT: Some feelings of bloating in the abdomen. Passage of more gas than usual. Walking can help get rid of the air that was put into your GI tract during the procedure and reduce the bloating. If you had a lower endoscopy (such as a colonoscopy or flexible sigmoidoscopy) you may notice spotting of blood in your stool or on the toilet paper. Some abdominal soreness may be present for a day or two, also.  DIET: Your first meal following the procedure should be a light meal and then it is ok to progress to your normal diet. A half-sandwich or bowl of soup is an example of a good first meal. Heavy or fried foods are harder to digest and may make you feel nauseous or bloated. Drink plenty of fluids but you should avoid alcoholic beverages for 24 hours. If you had a esophageal dilation, please see attached instructions for diet.    ACTIVITY: Your care partner should take you home directly after the procedure. You should plan to take it easy, moving slowly for the rest of the day. You can resume normal activity the day after the procedure however YOU SHOULD NOT DRIVE, use power tools, machinery or perform tasks that involve climbing or major physical exertion for 24 hours (because of the sedation medicines used during the test).   SYMPTOMS TO REPORT IMMEDIATELY: A gastroenterologist can be reached at any hour. Please call [phone]  for any of the following symptoms:   Following upper endoscopy (EGD, EUS, ERCP, esophageal dilation) Vomiting of blood or coffee ground material  New, significant abdominal pain  New, significant chest pain or pain under the shoulder blades  Painful or  persistently difficult swallowing  New shortness of breath  Black, tarry-looking or red, bloody stools  FOLLOW UP:  If any biopsies were taken you will be contacted by phone or by letter within the next 1-3 weeks. Call [phone]  if you have not heard about the biopsies in 3 weeks.  Please also call with any specific questions about appointments or follow up tests.  

## 2023-04-08 NOTE — Anesthesia Postprocedure Evaluation (Signed)
Anesthesia Post Note  Patient: Tony Pham  Procedure(s) Performed: ESOPHAGOGASTRODUODENOSCOPY (EGD) WITH PROPOFOL BIOPSY     Patient location during evaluation: PACU Anesthesia Type: MAC Level of consciousness: awake and alert Pain management: pain level controlled Vital Signs Assessment: post-procedure vital signs reviewed and stable Respiratory status: spontaneous breathing, nonlabored ventilation and respiratory function stable Cardiovascular status: blood pressure returned to baseline and stable Postop Assessment: no apparent nausea or vomiting Anesthetic complications: no   No notable events documented.  Last Vitals:  Vitals:   04/08/23 0937 04/08/23 0947  BP: (!) 150/70 (!) 158/67  Pulse: 79 69  Resp: 16 (!) 23  Temp:    SpO2: 98% 96%    Last Pain:  Vitals:   04/08/23 0947  TempSrc:   PainSc: 0-No pain                 [name]

## 2023-04-08 NOTE — Transfer of Care (Signed)
Immediate Anesthesia Transfer of Care Note  Patient: Tony Pham  Procedure(s) Performed: ESOPHAGOGASTRODUODENOSCOPY (EGD) WITH PROPOFOL BIOPSY  Patient Location: PACU and Endoscopy Unit  Anesthesia Type:MAC  Level of Consciousness: awake, alert , oriented, and patient cooperative  Airway & Oxygen Therapy: Patient Spontanous Breathing and Patient connected to face mask oxygen  Post-op Assessment: Report given to RN, Post -op Vital signs reviewed and stable, and Patient moving all extremities  Post vital signs: Reviewed and stable  Last Vitals:  Vitals Value Taken Time  BP    Temp    Pulse    Resp    SpO2      Last Pain:  Vitals:   04/08/23 0715  TempSrc: Temporal  PainSc: 0-No pain         Complications: No notable events documented.

## 2023-04-08 NOTE — H&P (Signed)
GASTROENTEROLOGY PROCEDURE H&P NOTE   Primary Care Physician: Rayetta Humphrey, MD    Reason for Procedure:   Eosinophilic esophagitis, gastric intestinal metaplasia  Plan:    EGD  Patient is appropriate for endoscopic procedure(s) in the hospital setting.  The nature of the procedure, as well as the risks, benefits, and alternatives were carefully and thoroughly reviewed with the patient. Ample time for discussion and questions allowed. The patient understood, was satisfied, and agreed to proceed.     HPI: Tony Pham is a 32 y.o. male who presents for EGD for eosinophilic esophagitis and gastric intestinal metaplasia. He has been taking his pantoprazole 40 mg BID, though he has still been experiencing some regurgitation on this medication. It is not clear whether or not the pantoprazole has been helping with his symptoms.  Past Medical History:  Diagnosis Date   Arthritis    Asthma    Depression    GERD (gastroesophageal reflux disease)    Obesity    Seasonal allergies    Sleep apnea    Thyroid cancer (HCC)    Thyroid disease     Past Surgical History:  Procedure Laterality Date   BIOPSY  12/24/2022   Procedure: BIOPSY;  Surgeon: Imogene Burn, MD;  Location: [facility];  Service: Gastroenterology;;   COLONOSCOPY WITH PROPOFOL N/A 12/24/2022   Procedure: COLONOSCOPY WITH PROPOFOL;  Surgeon: Imogene Burn, MD;  Location: [facility];  Service: Gastroenterology;  Laterality: N/A;   ESOPHAGOGASTRODUODENOSCOPY (EGD) WITH PROPOFOL N/A 12/24/2022   Procedure: ESOPHAGOGASTRODUODENOSCOPY (EGD) WITH PROPOFOL;  Surgeon: Imogene Burn, MD;  Location: [facility];  Service: Gastroenterology;  Laterality: N/A;   POLYPECTOMY  12/24/2022   Procedure: POLYPECTOMY;  Surgeon: Imogene Burn, MD;  Location: [facility];  Service: Gastroenterology;;   THYROIDECTOMY      Prior to Admission medications   Medication Sig Start Date End Date Taking? Authorizing Provider  Ascorbic  Acid (VITAMIN C) 1000 MG tablet Take 1,000 mg by mouth daily.   Yes [provider]  b complex vitamins capsule Take 1 capsule by mouth daily.   Yes [provider]  fluticasone (FLONASE) 50 MCG/ACT nasal spray Place 1 spray into both nostrils 2 (two) times daily.   Yes [provider]  levothyroxine (SYNTHROID) 137 MCG tablet Take 274 mcg by mouth daily before breakfast.   Yes [provider]  loratadine (CLARITIN) 10 MG tablet Take 10 mg by mouth daily.   Yes [provider]  meloxicam (MOBIC) 15 MG tablet Take 15 mg by mouth daily. 12/17/22  Yes [provider]  montelukast (SINGULAIR) 10 MG tablet Take 1 tablet by mouth at bedtime. 05/06/20  Yes [provider]  Multiple Vitamin (MULTIVITAMIN) capsule Take 1 capsule by mouth daily.   Yes [provider]  pantoprazole (PROTONIX) 40 MG tablet Take 1 tablet (40 mg total) by mouth 2 (two) times daily before a meal. 12/27/22 04/08/23 Yes Imogene Burn, MD  sertraline (ZOLOFT) 50 MG tablet Take 50 mg by mouth daily. 12/16/22  Yes [provider]  VITAMIN D, CHOLECALCIFEROL, PO Take 5,000 Units by mouth daily.   Yes [provider]  albuterol (PROVENTIL) (2.5 MG/3ML) 0.083% nebulizer solution Take 3 mLs (2.5 mg total) by nebulization every 6 (six) hours as needed for wheezing or shortness of breath. 11/03/21   Bing Neighbors, NP  albuterol (VENTOLIN HFA) 108 (90 Base) MCG/ACT inhaler Inhale 2 puffs into the lungs every 6 (six) hours as  needed for wheezing or shortness of breath.    [provider]    Current Facility-Administered Medications  Medication Dose Route Frequency Provider Last Rate Last Admin   0.9 %  sodium chloride infusion   Intravenous Continuous Imogene Burn, MD       lactated ringers infusion   Intravenous Continuous Imogene Burn, MD 50 mL/hr at 04/08/23 0717 New Bag at 04/08/23 0717    Allergies as of 01/31/2023   (No Known  Allergies)    Family History  Problem Relation Age of Onset   Schizophrenia Sister    Depression Sister    Colon cancer Neg Hx     Social History   Socioeconomic History   Marital status: Married    Spouse name: Not on file   Number of children: Not on file   Years of education: Not on file   Highest education level: Not on file  Occupational History   Not on file  Tobacco Use   Smoking status: Never   Smokeless tobacco: Never  Vaping Use   Vaping status: Never Used  Substance and Sexual Activity   Alcohol use: No   Drug use: No   Sexual activity: Not Currently  Other Topics Concern   Not on file  Social History Narrative   Not on file   Social Determinants of Health   Financial Resource Strain: High Risk (04/05/2022)   Received from [facility], [facility]   Overall Financial Resource Strain (CARDIA)    Difficulty of Paying Living Expenses: Very hard  Food Insecurity: Food Insecurity Present (04/05/2022)   Received from [facility], [facility]   Hunger Vital Sign    Worried About Running Out of Food in the Last Year: Often true    Ran Out of Food in the Last Year: Often true  Transportation Needs: Unmet Transportation Needs (04/05/2022)   Received from [facility], [facility]   PRAPARE - Transportation    In the past 12 months, has lack of transportation kept you from medical appointments or from getting medications?: Yes    Lack of Transportation (Non-Medical): Yes  Physical Activity: Not on file  Stress: Not on file  Social Connections: Not on file  Intimate Partner Violence: Not on file    Physical Exam: Vital signs in last 24 hours: BP (!) 145/66   Pulse 68   Temp 97.8 F (36.6 C) (Temporal)   Resp 20   Ht 5\' 9"  (1.753 m)   Wt (!) 170.1 kg   SpO2 98%   BMI 55.38 kg/m  GEN: NAD EYE: Sclerae anicteric ENT: MMM CV: Non-tachycardic Pulm:  No increased work of breathing GI: Soft, NT/ND NEURO:  Alert & Oriented   Eulah Pont, MD [facility]  04/08/2023 8:01 AM

## 2023-04-08 NOTE — Op Note (Addendum)
[facility] Patient Name: Tony Pham Procedure Date: 04/08/2023 MRN: [id number] Attending MD: Particia Lather , , [id number] Date of Birth: [birth year] CSN: [id number] Age: 32 Admit Type: Outpatient Procedure:                Upper GI endoscopy Indications:              Follow-up of eosinophilic esophagitis, Follow-up of                            intestinal metaplasia Providers:                Madelyn Brunner" Marolyn Hammock, RN, Fransisca Connors, Marja Kays, Technician, Cephus Richer, RN Referring MD:             [name] Greggory Stallion MD, MD Medicines:                Monitored Anesthesia Care Complications:            No immediate complications. Estimated Blood Loss:     Estimated blood loss was minimal. Procedure:                Pre-Anesthesia Assessment:                           - Prior to the procedure, a History and Physical                            was performed, and patient medications and                            allergies were reviewed. The patient's tolerance of                            previous anesthesia was also reviewed. The risks                            and benefits of the procedure and the sedation                            options and risks were discussed with the patient.                            All questions were answered, and informed consent                            was obtained. Prior Anticoagulants: The patient has                            taken no anticoagulant or antiplatelet agents. ASA  Grade Assessment: III - A patient with severe                            systemic disease. After reviewing the risks and                            benefits, the patient was deemed in satisfactory                            condition to undergo the procedure.                           After obtaining informed consent, the endoscope was                             passed under direct vision. Throughout the                            procedure, the patient's blood pressure, pulse, and                            oxygen saturations were monitored continuously. The                            [id number]-H190 ([id number]) Olympus endoscope was introduced                            through the mouth, and advanced to the second part                            of duodenum. The upper GI endoscopy was                            accomplished without difficulty. The patient                            tolerated the procedure well. Scope In: Scope Out: Findings:      Mucosal changes were found in the entire esophagus. Esophageal findings       were graded using the Eosinophilic Esophagitis Endoscopic Reference       Score (EoE-EREFS) as: Edema Grade 1 Present (decreased clarity or       absence of vascular markings), Rings Grade 1 Mild (subtle       circumferential ridges seen on esophageal distension), Exudates Grade 0       None (no white lesions seen), Furrows Grade 1 Mild (vertical lines       without visible depth) and Stricture none (no stricture found). Biopsies       were obtained from the proximal and distal esophagus with cold forceps       for histology of suspected eosinophilic esophagitis.      Evidence of a sleeve gastrectomy was found in the gastric body. This was       characterized by healthy appearing mucosa.      The entire examined stomach was normal. Biopsies for gastric mapping of  gastric intestinal metpalasia were obtained on the greater curvature of       the gastric body, on the lesser curvature of the gastric body, at the       incisura, on the greater curvature of the gastric antrum and on the       lesser curvature of the gastric antrum with cold forceps for histology.       Biopsies were separated into two jars, one with the gastric body and one       with the gastric antrum and incisura.      The examined duodenum was  normal. Impression:               - Esophageal mucosal changes secondary to                            eosinophilic esophagitis.                           - A sleeve gastrectomy was found, characterized by                            healthy appearing mucosa.                           - Normal stomach.                           - Normal examined duodenum.                           - Biopsies were taken with a cold forceps for                            evaluation of eosinophilic esophagitis.                           - Biopsies were obtained on the greater curvature                            of the gastric body, on the lesser curvature of the                            gastric body, at the incisura, on the greater                            curvature of the gastric antrum and on the lesser                            curvature of the gastric antrum. Moderate Sedation:      Not Applicable - Patient had care per Anesthesia. Recommendation:           - Discharge patient to home (with escort).                           - Await pathology results.                           -  Return to GI clinic in 2-3 months.                           - The findings and recommendations were discussed                            with the patient. Procedure Code(s):        --- Professional ---                           [phone], Esophagogastroduodenoscopy, flexible,                            transoral; with biopsy, single or multiple Diagnosis Code(s):        --- Professional ---                           K20.0, Eosinophilic esophagitis                           K31.A0, Gastric intestinal metaplasia, unspecified CPT copyright 2022 American Medical Association. All rights reserved. The codes documented in this report are preliminary and upon coder review may  be revised to meet current compliance requirements. Dr Particia Lather "Tony Pham" [place],  04/08/2023 9:23:38 AM Number of Addenda: 0

## 2023-04-09 ENCOUNTER — Encounter (HOSPITAL_COMMUNITY): Payer: Self-pay | Admitting: Internal Medicine

## 2023-04-09 LAB — SURGICAL PATHOLOGY

## 2023-04-17 ENCOUNTER — Other Ambulatory Visit: Payer: Self-pay

## 2023-04-17 MED ORDER — PANTOPRAZOLE SODIUM 40 MG PO TBEC: 40.0000 mg | DELAYED_RELEASE_TABLET | Freq: Two times a day (BID) | ORAL | 0 refills | Status: DC

## 2023-04-18 ENCOUNTER — Other Ambulatory Visit (HOSPITAL_COMMUNITY): Payer: Self-pay

## 2023-04-18 ENCOUNTER — Telehealth: Payer: Self-pay | Admitting: Pharmacy Technician

## 2023-04-18 NOTE — Telephone Encounter (Signed)
Pharmacy Patient Advocate Encounter   Received notification from CoverMyMeds that prior authorization for PANTOPRAZOLE 40MG  is required/requested.   Insurance verification completed.   The patient is insured through [facility] .   Per test claim: PA required; PA submitted to [facility] via CoverMyMeds Key/confirmation #/EOC [id number] Status is pending

## 2023-04-19 ENCOUNTER — Other Ambulatory Visit (HOSPITAL_COMMUNITY): Payer: Self-pay

## 2023-04-19 NOTE — Telephone Encounter (Signed)
Pharmacy Patient Advocate Encounter  Received notification from [facility] that Prior Authorization for PANTOPRAZOLE 40 MG has been APPROVED from 04/18/2023 to 04/17/2024. Ran test claim, Copay is $Unavailable   PA #/Case ID/Reference #: [id number]

## 2023-07-29 ENCOUNTER — Ambulatory Visit: Payer: Self-pay | Admitting: Urology

## 2023-08-11 DIAGNOSIS — K200 Eosinophilic esophagitis: Secondary | ICD-10-CM

## 2023-08-12 ENCOUNTER — Telehealth: Payer: Self-pay | Admitting: Physician Assistant

## 2023-08-12 ENCOUNTER — Other Ambulatory Visit: Payer: Self-pay | Admitting: Physician Assistant

## 2023-08-12 MED ORDER — FAMOTIDINE 20 MG PO TABS: 20.0000 mg | ORAL_TABLET | Freq: Two times a day (BID) | ORAL | 3 refills | Status: DC

## 2023-08-12 MED ORDER — FLUTICASONE PROPIONATE HFA 220 MCG/ACT IN AERO: INHALATION_SPRAY | RESPIRATORY_TRACT | 2 refills | Status: AC

## 2023-08-12 NOTE — Telephone Encounter (Signed)
Continue to have issues with regurgitation, states symptoms started after  States if he eats or drinks he has regurgitation.  10/2022 had OV with myself for dysphagia, had EGD in 12/2022 that showed EOE and gastric polyp was removed.  He states after that he had no dysphagia but he had regurgitations.  Worse with carbs/starches, will have some acid come up with it, can be from several hours prior the food will come back up.  He can have liquids regurgitate but denies coughing. Denies nausea, vomiting, Ab pain.  He will take protonix 40 mg twice a day before eating or drinking.

## 2023-08-12 NOTE — Telephone Encounter (Signed)
Patient called and stated he was return your call. Patient also stated he would like if you could return his call. Please Advise.

## 2023-08-12 NOTE — Telephone Encounter (Signed)
barium swallow showed no dysmotility.  The EGD in July did show that you are responding well to the PPI therapy but you are continuing with your symptoms.   Will do 1-3 months of steroid therapy and then stop and continue on just the PPI.  Schedule follow up with Dr. Leonides Schanz to evaluate response.  Can consider  esophageal manometry to evaluate the function better.  Dysmotility guidelines given  Marchelle Folks

## 2023-08-13 ENCOUNTER — Other Ambulatory Visit: Payer: Self-pay

## 2023-08-13 DIAGNOSIS — K2100 Gastro-esophageal reflux disease with esophagitis, without bleeding: Secondary | ICD-10-CM

## 2023-08-13 NOTE — Telephone Encounter (Signed)
Pt scheduled for EM with 24hr ph probe at [facility] 01/01/24 at 8:30am. IONG#[id number]. Reminder in epic for pt to be scheduled for an OV around June 2025.

## 2023-08-13 NOTE — Telephone Encounter (Signed)
See phone note

## 2023-08-13 NOTE — Telephone Encounter (Signed)
Discussed with Dr. Leonides Schanz and the patient.  Please schedule for esophageal manometry and Ph study on protonix to evaluate for regurgitation/GERD.  Will continue with dysmotility/dysphagia information given to the patient via messenger.

## 2023-08-26 ENCOUNTER — Ambulatory Visit: Payer: Commercial Managed Care - PPO | Admitting: Urology

## 2023-08-26 ENCOUNTER — Encounter: Payer: Self-pay | Admitting: Urology

## 2023-08-26 VITALS — BP 143/80 | HR 65 | Ht 69.0 in | Wt 370.0 lb

## 2023-08-26 DIAGNOSIS — E291 Testicular hypofunction: Secondary | ICD-10-CM

## 2023-08-26 DIAGNOSIS — R5383 Other fatigue: Secondary | ICD-10-CM

## 2023-08-26 NOTE — Progress Notes (Signed)
I, Tony Pham, acting as a scribe for Tony Altes, MD., have documented all relevant documentation on the behalf of Tony Altes, MD, as directed by Tony Altes, MD while in the presence of Tony Altes, MD.  08/26/2023 4:51 PM   Tony Pham [birth year] [id number]  Referring provider: Allayne Pham., MD [address] [place],  [place] [address]  Chief Complaint  Patient presents with   Establish Care   Hypogonadism    HPI: Tony Pham is a 32 y.o. male referred for hypogonadism.   Patient with chronic tiredness and fatigue. He has a history of thyroid cancer on thyroid placement. He also has sleep apnea on CPAP which is maximized. He has had 2 total testosterone levels checked, which are in the low normal range at 305 ng/dL and 130 ng/dL. Free testosterone levels were also checked, which were in the normal range.  Denies decreased libido or ED.  No bothersome lower urinary tract symptoms.  He is married with 1 child and is fairly certain he does not want additional children.    PMH: Past Medical History:  Diagnosis Date   Arthritis    Asthma    Depression    GERD (gastroesophageal reflux disease)    Obesity    Seasonal allergies    Sleep apnea    Thyroid cancer (HCC)    Thyroid disease     Surgical History: Past Surgical History:  Procedure Laterality Date   BIOPSY  12/24/2022   Procedure: BIOPSY;  Surgeon: Tony Burn, MD;  Location: [facility];  Service: Gastroenterology;;   BIOPSY  04/08/2023   Procedure: BIOPSY;  Surgeon: Tony Burn, MD;  Location: [facility];  Service: Gastroenterology;;   COLONOSCOPY WITH PROPOFOL N/A 12/24/2022   Procedure: COLONOSCOPY WITH PROPOFOL;  Surgeon: Tony Burn, MD;  Location: [facility];  Service: Gastroenterology;  Laterality: N/A;   ESOPHAGOGASTRODUODENOSCOPY (EGD) WITH PROPOFOL N/A 12/24/2022   Procedure: ESOPHAGOGASTRODUODENOSCOPY (EGD) WITH PROPOFOL;  Surgeon: Tony Burn, MD;  Location: [facility];  Service: Gastroenterology;  Laterality: N/A;   ESOPHAGOGASTRODUODENOSCOPY (EGD) WITH PROPOFOL N/A 04/08/2023   Procedure: ESOPHAGOGASTRODUODENOSCOPY (EGD) WITH PROPOFOL;  Surgeon: Tony Burn, MD;  Location: [facility];  Service: Gastroenterology;  Laterality: N/A;   POLYPECTOMY  12/24/2022   Procedure: POLYPECTOMY;  Surgeon: Tony Burn, MD;  Location: [facility];  Service: Gastroenterology;;   THYROIDECTOMY      Home Medications:  Allergies as of 08/26/2023   No Known Allergies      Medication List        Accurate as of August 26, 2023  4:51 PM. If you have any questions, ask your nurse or doctor.          albuterol 108 (90 Base) MCG/ACT inhaler Commonly known as: VENTOLIN HFA Inhale 2 puffs into the lungs every 6 (six) hours as needed for wheezing or shortness of breath.   albuterol (2.5 MG/3ML) 0.083% nebulizer solution Commonly known as: PROVENTIL Take 3 mLs (2.5 mg total) by nebulization every 6 (six) hours as needed for wheezing or shortness of breath.   b complex vitamins capsule Take 1 capsule by mouth daily.   famotidine 20 MG tablet Commonly known as: Pepcid Take 1 tablet (20 mg total) by mouth 2 (two) times daily.   fluticasone 220 MCG/ACT inhaler Commonly known as: FLOVENT HFA 2 puffs into mouth morning and night, swallow medication, do not eat or drink for 30 mins afterwards  fluticasone 50 MCG/ACT nasal spray Commonly known as: FLONASE Place 1 spray into both nostrils 2 (two) times daily.   levothyroxine 137 MCG tablet Commonly known as: SYNTHROID Take 274 mcg by mouth daily before breakfast.   loratadine 10 MG tablet Commonly known as: CLARITIN Take 10 mg by mouth daily.   meloxicam 15 MG tablet Commonly known as: MOBIC Take 15 mg by mouth daily.   montelukast 10 MG tablet Commonly known as: SINGULAIR Take 1 tablet by mouth at bedtime.   multivitamin capsule Take 1 capsule by mouth daily.   pantoprazole 40 MG  tablet Commonly known as: PROTONIX Take 1 tablet (40 mg total) by mouth 2 (two) times daily before a meal.   sertraline 50 MG tablet Commonly known as: ZOLOFT Take 50 mg by mouth daily.   vitamin C 1000 MG tablet Take 1,000 mg by mouth daily.   VITAMIN D (CHOLECALCIFEROL) PO Take 5,000 Units by mouth daily.        Allergies: No Known Allergies  Family History: Family History  Problem Relation Age of Onset   Schizophrenia Sister    Depression Sister    Colon cancer Neg Hx     Social History:  reports that he has never smoked. He has never used smokeless tobacco. He reports that he does not drink alcohol and does not use drugs.   Physical Exam: BP (!) 143/80   Pulse 65   Ht 5\' 9"  (1.753 m)   Wt (!) 370 lb (167.8 kg)   BMI 54.64 kg/m   Constitutional:  Alert and oriented, No acute distress. HEENT: [place] AT, moist mucus membranes.  Trachea midline, no masses. Cardiovascular: No clubbing, cyanosis, or edema. Respiratory: Normal respiratory effort, no increased work of breathing. GI: Abdomen is soft, nontender, nondistended, no abdominal masses GU: Buried penis. Testes retracted but not atrophic. Skin: No rashes, bruises or suspicious lesions. Neurologic: Grossly intact, no focal deficits, moving all 4 extremities. Psychiatric: Normal mood and affect.   Assessment & Plan:    1. Tiredness/fatigue Low normal testosterone levels.  His free testosterone levels are normal, although AUA guidelines for treatment of hypogonadism do not recommend basing treatment on free testosterone levels and only total testosterone.  LH drawn today.  If LH not elevated, would initially recommend a trial of Clomid with follow-up testosterone level and symptom check in 2 months.  We discussed if LH is abnormally low, he would need a pituitary MRI. If elevated, then would discuss testosterone replacement.  [facility] [address], [address] [place], [place]  [address] [phone]

## 2023-08-27 LAB — LUTEINIZING HORMONE: LH: 8.5 m[IU]/mL (ref 1.7–8.6)

## 2023-09-11 ENCOUNTER — Encounter: Payer: Self-pay | Admitting: Urology

## 2023-09-11 DIAGNOSIS — E291 Testicular hypofunction: Secondary | ICD-10-CM

## 2023-09-13 ENCOUNTER — Other Ambulatory Visit: Payer: Self-pay | Admitting: Urology

## 2023-09-13 MED ORDER — CLOMIPHENE CITRATE 50 MG PO TABS: 25.0000 mg | ORAL_TABLET | Freq: Every day | ORAL | 0 refills | Status: DC

## 2023-10-14 ENCOUNTER — Telehealth: Payer: Self-pay | Admitting: Internal Medicine

## 2023-10-14 NOTE — Telephone Encounter (Signed)
Patient called would like to know if the appt scheduled for 10/18/23 can be virtual. Please advise.

## 2023-10-18 ENCOUNTER — Telehealth (INDEPENDENT_AMBULATORY_CARE_PROVIDER_SITE_OTHER): Payer: Commercial Managed Care - PPO | Admitting: Internal Medicine

## 2023-10-18 DIAGNOSIS — Z9884 Bariatric surgery status: Secondary | ICD-10-CM | POA: Diagnosis not present

## 2023-10-18 DIAGNOSIS — Z6841 Body Mass Index (BMI) 40.0 and over, adult: Secondary | ICD-10-CM | POA: Diagnosis not present

## 2023-10-18 DIAGNOSIS — K2100 Gastro-esophageal reflux disease with esophagitis, without bleeding: Secondary | ICD-10-CM | POA: Diagnosis not present

## 2023-10-18 DIAGNOSIS — K200 Eosinophilic esophagitis: Secondary | ICD-10-CM

## 2023-10-18 NOTE — Progress Notes (Signed)
10/18/2023 Tony Pham [id number] 1991-04-10  Referring provider: Rayetta Humphrey, MD Primary GI doctor: Dr. 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 Famotidine has helped with his symptoms. His last EGD showed that he has PPI responsive EoE. Will add on Reflux Gourmet to see if this is able to help with his regurgitation further - Continue pantoprazole and famotidine - Try Reflux Gourmet TID with meals for 1 month. If not effective, then will try baclofen  Thyroid carcinoma [facility]) S/p thyroidectomy and RAI ablation  History of bariatric surgery History of gastric sleeve  Obstructive sleep apnea (adult) (pediatric) On CPAP  BMI 50.0-59.9, adult (HCC) Patient will be scheduled at the hospital for their procedure due to ASA IV criteria: BMI over 50   Elevated CRP   Patient Care Team: Rayetta Humphrey, MD as PCP - General (Family Medicine)  HISTORY OF PRESENT ILLNESS: 33 y.o. male with a past medical history of papillary thyroid cancer status post RAI ablation and thyroidectomy 2013, OSA, status post gastric sleeve 2018 and others listed below presents for follow up of dysphagia and EoE  Interval History: Whenever he eats something, he feels like his esophageal sphincter is not keeping stuff in his stomach. Endorses regurgitation. Denies dysphagia, which resolved after being on pantoprazole. Pepcid has helped some with his chest burning. Denies rectal bleeding.   Wt Readings from Last 3 Encounters:  08/26/23 (!) 370 lb (167.8 kg)  04/08/23 (!) 375 lb (170.1 kg)  02/04/23 (!) 380 lb (172.4 kg)   10/08/2022 labs reviewed showed WBC 13, Hgb 13.5, platelets 312, MCV 90, B12 696, iron 59, percent saturation 21, TIBC 287, ferritin 79, folate 220 CRP was elevated at 3.93 Vitamin D  33, potassium 3.4, normal magnesium, normal kidney otherwise. Alk phos 112, AST 20, ALT 21, total bili 0.4 TSH 2.92  Barium swallow 11/23/22: IMPRESSION: Small sliding-type hiatal hernia. No mass, stricture, significant dysmotility or gastroesophageal reflux  EGD 12/24/22: - Esophageal furrows. Biopsied. - Gastroesophageal junction polyp( s) were found. Resected and retrieved. - One gastric polyp. Resected and retrieved. - A sleeve gastrectomy was found, characterized by healthy appearing mucosa. - Erythematous mucosa in the antrum. Biopsied. - Normal examined duodenum. Path: A. STOMACH, BIOPSY:  Reactive gastropathy and minimal chronic gastritis with lymphoid  aggregates  Focal intestinal metaplasia  Negative for H. pylori, dysplasia and carcinoma  B. ESOPHAGUS, DISTAL, BIOPSY:  Eosinophil-rich esophagitis (greater than 50 eos/hpf) (see comment)  Scant cardia type gastric mucosa with no diagnostic abnormality  Negative for intestinal metaplasia, dysplasia and carcinoma  C. ESOPHAGUS, PROXIMAL, BIOPSY:  Eosinophil-rich esophagitis ((greater than 40 eos/hpf) (see comment)  Scant cardia type gastric mucosa with no diagnostic abnormality and  suggestive of inlet patch  Negative for intestinal metaplasia, dysplasia and carcinoma  D. GE JUNCTION, POLYPECTOMY: Compatible with hyperplastic polyp Acute erosive esophagitis Chronic active gastritis Focal pancreatic metaplasia Negative for intestinal metaplasia, dysplasia and carcinoma  Comment: Both the distal and proximal esophageal biopsies exhibit an eosinophil  rich esophagitis the differential diagnosis of which would include true  eosinophilic esophagitis (favored) and severe reflux esophagitis.  Clinical and endoscopic correlation is recommended.   Colonoscopy 12/24/22: - The examined portion of the ileum was normal. - Localized inflammation was found at the appendiceal orifice. Biopsied. - One 2 mm polyp in the cecum, removed with a  cold biopsy forceps. Resected and retrieved. - One 3 mm polyp in the rectum, removed with a cold snare. Resected and retrieved. - Non- bleeding internal hemorrhoids. - Anal fissure found on perianal exam. Path: E. APPENDICEAL ORIFICE, BIOPSY:  Focal active colitis. The appendiceal orifice biopsy shows a focal active colitis the histologic changes of which have been most commonly associated with a resolving acute self-limited colitis; however, certain drugs particularly NSAIDs may elicit similar changes.  Additionally given the location no underlying acute appendicitis might be considered.  Clinical and endoscopic correlation is recommended.  F. CECUM, POLYPECTOMY:  Benign colonic mucosa with prominent lymphoid aggregate  Negative for dysplasia and carcinoma  G. RECTUM, POLYPECTOMY:  Hyperplastic polyp  Negative for dysplasia and carcinoma   EGD 7/22/224: - Esophageal mucosal changes secondary to eosinophilic esophagitis. - A sleeve gastrectomy was found, characterized by healthy appearing mucosa. - Normal stomach. - Normal examined duodenum. - Biopsies were taken with a cold forceps for evaluation of eosinophilic esophagitis. - Biopsies were obtained on the greater curvature of the gastric body, on the lesser curvature of the gastric body, at the incisura, on the greater curvature of the gastric antrum and on the lesser curvature of the gastric antrum. Path: A. STOMACH, ANTRUM, BIOPSY:  Reactive gastropathy and mild chronic gastritis with lymphoid aggregate  showing focal intestinal metaplasia  Negative for H. pylori, dysplasia and carcinoma  B. STOMACH, BODY, BIOPSY:  Minimal chronic gastritis with reactive epithelial changes  Negative for H. pylori, intestinal metaplasia, dysplasia and carcinoma  C. DISTAL ESOPHAGUS, BIOPSY:  Reactive squamous mucosa  Scant benign gastric mucosa with no diagnostic abnormality  Negative for eosinophils, dysplasia and carcinoma  D. PROXIMAL ESOPHAGUS,  BIOPSY:  Reactive squamous mucosa with a single eosinophil (1 EOS/HPF)  Benign oxyntocardia type gastric mucosa compatible with inlet patch  Negative for dysplasia and carcinoma   He  reports that he has never smoked. He has never used smokeless tobacco. He reports that he does not drink alcohol and does not use drugs.  RELEVANT LABS AND IMAGING:  Current Medications:   Current Outpatient Medications (Endocrine & Metabolic):    clomiPHENE (CLOMID) 50 MG tablet, Take 0.5 tablets (25 mg total) by mouth daily.   levothyroxine (SYNTHROID) 137 MCG tablet, Take 274 mcg by mouth daily before breakfast.   Current Outpatient Medications (Respiratory):    albuterol (PROVENTIL) (2.5 MG/3ML) 0.083% nebulizer solution, Take 3 mLs (2.5 mg total) by nebulization every 6 (six) hours as needed for wheezing or shortness of breath.   albuterol (VENTOLIN HFA) 108 (90 Base) MCG/ACT inhaler, Inhale 2 puffs into the lungs every 6 (six) hours as needed for wheezing or shortness of breath.   fluticasone (FLONASE) 50 MCG/ACT nasal spray, Place 1 spray into both nostrils 2 (two) times daily.   fluticasone (FLOVENT HFA) 220 MCG/ACT inhaler, 2 puffs into mouth morning and night, swallow medication, do not eat or drink for 30 mins afterwards   loratadine (CLARITIN) 10 MG tablet, Take 10 mg by mouth daily.   montelukast (SINGULAIR) 10 MG tablet, Take 1 tablet by mouth at bedtime.  Current Outpatient Medications (Analgesics):    meloxicam (MOBIC) 15 MG tablet, Take 15 mg by mouth daily.   Current Outpatient Medications (Other):    Ascorbic Acid (VITAMIN C) 1000 MG tablet, Take 1,000 mg by mouth daily.   b complex vitamins capsule, Take 1 capsule by mouth daily.   famotidine (PEPCID) 20 MG tablet, Take 1 tablet (20 mg total) by mouth 2 (two)  times daily.   Multiple Vitamin (MULTIVITAMIN) capsule, Take 1 capsule by mouth daily.   pantoprazole (PROTONIX) 40 MG tablet, Take 1 tablet (40 mg total) by mouth 2 (two) times  daily before a meal.   sertraline (ZOLOFT) 50 MG tablet, Take 50 mg by mouth daily.   VITAMIN D, CHOLECALCIFEROL, PO, Take 5,000 Units by mouth daily.   Medical History:  Past Medical History:  Diagnosis Date   Arthritis    Asthma    Depression    GERD (gastroesophageal reflux disease)    Obesity    Seasonal allergies    Sleep apnea    Thyroid cancer (HCC)    Thyroid disease    Allergies: No Known Allergies   Surgical History:  He  has a past surgical history that includes Thyroidectomy; Colonoscopy with propofol (N/A, 12/24/2022); Esophagogastroduodenoscopy (egd) with propofol (N/A, 12/24/2022); biopsy (12/24/2022); polypectomy (12/24/2022); Esophagogastroduodenoscopy (egd) with propofol (N/A, 04/08/2023); and biopsy (04/08/2023). Family History:  His family history includes Depression in his sister; Schizophrenia in his sister.  PHYSICAL EXAM: Not performed as part of telemedicine visit   Imogene Burn, MD 10:41 AM  I connected with  [name] on 10/18/23 by a video enabled telemedicine application and verified that I am speaking with the correct person using two identifiers. Patient is located in his parked car. I am located at the [facility] GI office.   I discussed the limitations of evaluation and management by telemedicine. The patient expressed understanding and agreed to proceed.  I spent 25 minutes of time, including independent review of results as outlined above, communicating results with the patient directly, face-to-face time with the patient, coordinating care, ordering studies and medications as appropriate, and documentation.

## 2023-10-18 NOTE — Patient Instructions (Signed)
Please purchase the following medications over the counter and take as directed: Reflux Gourmet three times a day with meals.   MyChart message [place] in one month and let us know if this is effective.  _______________________________________________________  If your blood pressure at your visit was 140/90 or greater, please contact your primary care physician to follow up on this.  _______________________________________________________  If you are age 47 or older, your body mass index should be between 23-30. Your There is no height or weight on file to calculate BMI. If this is out of the aforementioned range listed, please consider follow up with your Primary Care Provider.  If you are age 67 or younger, your body mass index should be between 19-25. Your There is no height or weight on file to calculate BMI. If this is out of the aformentioned range listed, please consider follow up with your Primary Care Provider.   ________________________________________________________  The [place] [facility] providers would like to encourage you to use [facility] to communicate with providers for non-urgent requests or questions.  Due to long hold times on the telephone, sending your provider a message by [facility] may be a faster and more efficient way to get a response.  Please allow 48 business hours for a response.  Please remember that this is for non-urgent requests.  _______________________________________________________

## 2023-10-28 ENCOUNTER — Other Ambulatory Visit: Payer: Commercial Managed Care - PPO

## 2023-10-29 ENCOUNTER — Other Ambulatory Visit: Payer: Commercial Managed Care - PPO

## 2023-10-29 DIAGNOSIS — E291 Testicular hypofunction: Secondary | ICD-10-CM

## 2023-10-30 LAB — TESTOSTERONE: Testosterone: 607 ng/dL (ref 264–916)

## 2023-11-01 ENCOUNTER — Encounter: Payer: Self-pay | Admitting: Urology

## 2023-11-08 ENCOUNTER — Other Ambulatory Visit: Payer: Self-pay | Admitting: Physician Assistant

## 2023-11-08 MED ORDER — PANTOPRAZOLE SODIUM 40 MG PO TBEC: 40.0000 mg | DELAYED_RELEASE_TABLET | Freq: Two times a day (BID) | ORAL | 0 refills | Status: AC

## 2023-11-13 ENCOUNTER — Other Ambulatory Visit: Payer: Self-pay | Admitting: Urology

## 2023-11-13 MED ORDER — CLOMIPHENE CITRATE 50 MG PO TABS: 25.0000 mg | ORAL_TABLET | Freq: Every day | ORAL | 0 refills | Status: DC

## 2023-12-24 ENCOUNTER — Telehealth: Payer: Self-pay

## 2023-12-24 NOTE — Telephone Encounter (Signed)
-----   Message from Nurse Tennis Must sent at 12/24/2023  9:35 AM EDT ----- Regarding: FW: OV with Leonides Schanz  ----- Message ----- From: Benancio Deeds, MD Sent: 12/24/2023   7:39 AM EDT To: Evalee Jefferson, LPN Subject: RE: OV with Sebastian Ache. Do you need something from me for this patient? 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 Thanks  Dr. Mervyn Skeeters ----- Message ----- From: Evalee Jefferson, LPN Sent: 0/09/930   1:20 PM EDT To: Benancio Deeds, MD Subject: Annell GreeningSudie Grumbling with Leonides Schanz                              ----- Message ----- From: Chrystie Nose, RN Sent: 12/23/2023  12:00 AM EDT To: Evalee Jefferson, LPN Subject: OV with Dorsey                                 Pt had EM with 24 hr ph in April and needed OV with Leonides Schanz in June. Schedule was not out yet. Please schedule an OV with Leonides Schanz in June.

## 2023-12-24 NOTE — Telephone Encounter (Signed)
 Patient is returning your call.

## 2023-12-24 NOTE — Telephone Encounter (Signed)
 Left message for pt to call back

## 2023-12-24 NOTE — Telephone Encounter (Signed)
 Pt was made aware of recommendations: Pt was scheduled to see Dr. Leonides Schanz on 02/26/2024 at 3:40 PM. Pt made aware. Pt verbalized understanding with all questions answered.

## 2023-12-30 ENCOUNTER — Other Ambulatory Visit: Payer: Self-pay | Admitting: Physician Assistant

## 2024-01-01 ENCOUNTER — Ambulatory Visit (HOSPITAL_COMMUNITY)
Admission: RE | Admit: 2024-01-01 | Discharge: 2024-01-01 | Disposition: A | Discharge status: Home / Self Care | Payer: Commercial Managed Care - PPO | Attending: Internal Medicine | Admitting: Internal Medicine

## 2024-01-01 ENCOUNTER — Encounter (HOSPITAL_COMMUNITY)
Admission: RE | Disposition: A | Discharge status: Home / Self Care | Payer: Self-pay | Source: Home / Self Care | Attending: Internal Medicine

## 2024-01-01 ENCOUNTER — Encounter (HOSPITAL_COMMUNITY): Payer: Self-pay | Admitting: Internal Medicine

## 2024-01-01 DIAGNOSIS — R059 Cough, unspecified: Secondary | ICD-10-CM

## 2024-01-01 DIAGNOSIS — K219 Gastro-esophageal reflux disease without esophagitis: Secondary | ICD-10-CM | POA: Diagnosis present

## 2024-01-01 HISTORY — PX: ESOPHAGEAL MANOMETRY: SHX5429

## 2024-01-01 HISTORY — PX: 24 HOUR PH STUDY: SHX5419

## 2024-01-01 SURGERY — MANOMETRY, ESOPHAGUS
Anesthesia: Choice

## 2024-01-01 MED ORDER — LIDOCAINE VISCOUS HCL 2 % MT SOLN
OROMUCOSAL | Status: AC
  Filled 2024-01-01: qty 15

## 2024-01-01 SURGICAL SUPPLY — 2 items
FACESHIELD LNG OPTICON STERILE (SAFETY) IMPLANT
GLOVE BIO SURGEON STRL SZ8 (GLOVE) ×2 IMPLANT

## 2024-01-01 NOTE — Progress Notes (Signed)
 Esophageal Manometry done per protocol. Pt tolerated well with out complication. Ph with impedance done per protocol. Pt tolerated well. Instructions given regarding the study and monitor. Pt verbalized understand and return demonstrated use of monitor. Pt will return tomorrow to have probe removed and monitor downloaded.

## 2024-01-03 ENCOUNTER — Encounter (HOSPITAL_COMMUNITY): Payer: Self-pay | Admitting: Internal Medicine

## 2024-01-06 ENCOUNTER — Other Ambulatory Visit: Payer: Self-pay | Admitting: Internal Medicine

## 2024-01-06 DIAGNOSIS — K2100 Gastro-esophageal reflux disease with esophagitis, without bleeding: Secondary | ICD-10-CM

## 2024-01-06 MED ORDER — BACLOFEN 5 MG PO TABS
5.0000 mg | ORAL_TABLET | Freq: Two times a day (BID) | ORAL | 2 refills | Status: DC
Start: 2024-01-06 — End: 2024-02-26

## 2024-01-06 NOTE — Progress Notes (Signed)
 Spoke to the patient today.  I informed him that his esophageal manometry test showed normal esophageal motility.  His pH impedance test shows that he likely has reflux hypersensitivity while he is on medications.  This suggest that the pantoprazole  that he is on appears to be helping to control his acid reflux, but he is very sensitive to any potential breakthrough reflux episodes.  In these situations sometimes citalopram 20 mg daily or nortriptyline 25 mg nightly may be beneficial for symptoms.  Patient is already on sertraline but has not found any benefit from this medication so I recommended that he reach out to his primary care physician to see if he can be weaned off of sertraline.  Once he is weaned off of sertraline, we could potentially start him on citalopram or nortriptyline.  Reflux Gourmet did not really help with his reflux symptoms.  Patient inquired about whether or not baclofen  is still an option.  Will start him on baclofen  5 mg twice daily before meals and can uptitrate as needed.  Patient is on a cruise currently.  I have an appointment with the patient in 02/2024.

## 2024-02-26 ENCOUNTER — Ambulatory Visit: Admitting: Internal Medicine

## 2024-02-26 ENCOUNTER — Encounter: Payer: Self-pay | Admitting: Internal Medicine

## 2024-02-26 VITALS — BP 110/78 | HR 100 | Ht 69.0 in | Wt 368.2 lb

## 2024-02-26 DIAGNOSIS — R1310 Dysphagia, unspecified: Secondary | ICD-10-CM

## 2024-02-26 DIAGNOSIS — Z6841 Body Mass Index (BMI) 40.0 and over, adult: Secondary | ICD-10-CM

## 2024-02-26 DIAGNOSIS — G4733 Obstructive sleep apnea (adult) (pediatric): Secondary | ICD-10-CM

## 2024-02-26 DIAGNOSIS — Z9884 Bariatric surgery status: Secondary | ICD-10-CM

## 2024-02-26 DIAGNOSIS — C73 Malignant neoplasm of thyroid gland: Secondary | ICD-10-CM

## 2024-02-26 DIAGNOSIS — K2100 Gastro-esophageal reflux disease with esophagitis, without bleeding: Secondary | ICD-10-CM

## 2024-02-26 DIAGNOSIS — K649 Unspecified hemorrhoids: Secondary | ICD-10-CM | POA: Diagnosis not present

## 2024-02-26 DIAGNOSIS — K200 Eosinophilic esophagitis: Secondary | ICD-10-CM | POA: Diagnosis not present

## 2024-02-26 MED ORDER — NORTRIPTYLINE HCL 25 MG PO CAPS: 25.0000 mg | ORAL_CAPSULE | Freq: Every day | ORAL | 3 refills | Status: DC

## 2024-02-26 MED ORDER — HYDROCORTISONE (PERIANAL) 2.5 % EX CREA: 1.0000 | TOPICAL_CREAM | Freq: Two times a day (BID) | CUTANEOUS | 0 refills | Status: AC

## 2024-02-26 NOTE — Patient Instructions (Addendum)
 We have sent the following medications to your pharmacy for you to pick up at your convenience: Nortriptyline , Hydrocortisone    If your blood pressure at your visit was 140/90 or greater, please contact your primary care physician to follow up on this.  _______________________________________________________  If you are age 33 or older, your body mass index should be between 23-30. Your Body mass index is 54.37 kg/m. If this is out of the aforementioned range listed, please consider follow up with your Primary Care Provider.  If you are age 52 or younger, your body mass index should be between 19-25. Your Body mass index is 54.37 kg/m. If this is out of the aformentioned range listed, please consider follow up with your Primary Care Provider.   ________________________________________________________  The [place] [facility] providers would like to encourage you to use MYCHART to communicate with providers for non-urgent requests or questions.  Due to long hold times on the telephone, sending your provider a message by [facility] may be a faster and more efficient way to get a response.  Please allow 48 business hours for a response.  Please remember that this is for non-urgent requests.  _______________________________________________________   Thank you for entrusting me with your care and for choosing [facility],  Dr. Regino Caprio

## 2024-02-26 NOTE — Progress Notes (Signed)
 02/26/2024 Tony Pham [id number] Apr 14, 1991  Referring provider: Alexander Anes, MD Primary GI doctor: Dr. Rosaline Coma  ASSESSMENT AND PLAN:  Esophageal dysphagia with GERD, personal history of gastric sleeve Eosinophilic esophagitis Reflux hypersensitivity History of papillary thyroid cancer s/p thyroidectomy and RAI Patient's dysphagia has resolved on pantoprazole . Famotidine  has helped with his symptoms. His last EGD showed that he has PPI responsive EoE. He had esophageal manometry and pH impedance study done on PPI therapy. His esophageal manometry test showed normal motility with intact smooth muscle augmentation, and his pH impedance test showed reflux hypersensitivity. Thus hopefully he will respond to nortriptyline therapy. If he does not respond to medications in the future, then may need to consider an antireflux procedure. - Continue pantoprazole  and famotidine  - Start nortriptyline 25 mg at bedtime - RTC 3 months  Hemorrhoids. Suspected to be the source of the patient's rectal bleeding. Patient declined rectal exam today - Anusol HC cream BID for 7 days  Thyroid carcinoma (HCC) S/p thyroidectomy and RAI ablation - following with endocrinology for elevated TSH soon  History of bariatric surgery History of gastric sleeve  Obstructive sleep apnea (adult) (pediatric) On CPAP  BMI 50.0-59.9, adult (HCC) Attempting to lose weight  Patient Care Team: Alexander Anes, MD as PCP - General (Family Medicine)  HISTORY OF PRESENT ILLNESS: 33 y.o. male with a past medical history of papillary thyroid cancer status post RAI ablation and thyroidectomy 2013, OSA, status post gastric sleeve 2018 and others listed below presents for follow up of GERD and EoE  Interval History: Baclofen  did not help with his reflux symptoms and made him drowsy. He is no longer taking his baclofen . He saw his PCP and he was able to weaned down on sertraline. He stopped his sertraline about a  month ago. Denies chest burning. Endorses regurgitation. He has been taking his pantoprazole  and Pepcid  therapies. Endocrinologist wants him to start Zepbound. Endorses some rectal bleeding. Denies rectal pain. TSH was slightly high but he has an appt with endocrinology next week to see if his thyroid medication needs to be adjusted.   Wt Readings from Last 3 Encounters:  02/26/24 (!) 368 lb 3.2 oz (167 kg)  08/26/23 (!) 370 lb (167.8 kg)  04/08/23 (!) 375 lb (170.1 kg)   10/08/2022 labs reviewed showed WBC 13, Hgb 13.5, platelets 312, MCV 90, B12 696, iron 59, percent saturation 21, TIBC 287, ferritin 79, folate 220 CRP was elevated at 3.93 Vitamin D 33, potassium 3.4, normal magnesium, normal kidney otherwise. Alk phos 112, AST 20, ALT 21, total bili 0.4 TSH 2.92  Labs 02/2024: TSH elevated at 9.84. FT4 nml. BMP nml. HbA1C 5.8%.   Barium swallow 11/23/22: IMPRESSION: Small sliding-type hiatal hernia. No mass, stricture, significant dysmotility or gastroesophageal reflux  EGD 12/24/22: - Esophageal furrows. Biopsied. - Gastroesophageal junction polyp( s) were found. Resected and retrieved. - One gastric polyp. Resected and retrieved. - A sleeve gastrectomy was found, characterized by healthy appearing mucosa. - Erythematous mucosa in the antrum. Biopsied. - Normal examined duodenum. Path: A. STOMACH, BIOPSY:  Reactive gastropathy and minimal chronic gastritis with lymphoid  aggregates  Focal intestinal metaplasia  Negative for H. pylori, dysplasia and carcinoma  B. ESOPHAGUS, DISTAL, BIOPSY:  Eosinophil-rich esophagitis (greater than 50 eos/hpf) (see comment)  Scant cardia type gastric mucosa with no diagnostic abnormality  Negative for intestinal metaplasia, dysplasia and carcinoma  C. ESOPHAGUS, PROXIMAL, BIOPSY:  Eosinophil-rich esophagitis ((greater than 40 eos/hpf) (see comment)  Scant cardia  type gastric mucosa with no diagnostic abnormality and  suggestive of inlet patch   Negative for intestinal metaplasia, dysplasia and carcinoma  D. GE JUNCTION, POLYPECTOMY: Compatible with hyperplastic polyp Acute erosive esophagitis Chronic active gastritis Focal pancreatic metaplasia Negative for intestinal metaplasia, dysplasia and carcinoma  Comment: Both the distal and proximal esophageal biopsies exhibit an eosinophil  rich esophagitis the differential diagnosis of which would include true  eosinophilic esophagitis (favored) and severe reflux esophagitis.  Clinical and endoscopic correlation is recommended.   Colonoscopy 12/24/22: - The examined portion of the ileum was normal. - Localized inflammation was found at the appendiceal orifice. Biopsied. - One 2 mm polyp in the cecum, removed with a cold biopsy forceps. Resected and retrieved. - One 3 mm polyp in the rectum, removed with a cold snare. Resected and retrieved. - Non- bleeding internal hemorrhoids. - Anal fissure found on perianal exam. Path: E. APPENDICEAL ORIFICE, BIOPSY:  Focal active colitis. The appendiceal orifice biopsy shows a focal active colitis the histologic changes of which have been most commonly associated with a resolving acute self-limited colitis; however, certain drugs particularly NSAIDs may elicit similar changes.  Additionally given the location no underlying acute appendicitis might be considered.  Clinical and endoscopic correlation is recommended.  F. CECUM, POLYPECTOMY:  Benign colonic mucosa with prominent lymphoid aggregate  Negative for dysplasia and carcinoma  G. RECTUM, POLYPECTOMY:  Hyperplastic polyp  Negative for dysplasia and carcinoma   EGD 7/22/224: - Esophageal mucosal changes secondary to eosinophilic esophagitis. - A sleeve gastrectomy was found, characterized by healthy appearing mucosa. - Normal stomach. - Normal examined duodenum. - Biopsies were taken with a cold forceps for evaluation of eosinophilic esophagitis. - Biopsies were obtained on the greater  curvature of the gastric body, on the lesser curvature of the gastric body, at the incisura, on the greater curvature of the gastric antrum and on the lesser curvature of the gastric antrum. Path: A. STOMACH, ANTRUM, BIOPSY:  Reactive gastropathy and mild chronic gastritis with lymphoid aggregate  showing focal intestinal metaplasia  Negative for H. pylori, dysplasia and carcinoma  B. STOMACH, BODY, BIOPSY:  Minimal chronic gastritis with reactive epithelial changes  Negative for H. pylori, intestinal metaplasia, dysplasia and carcinoma  C. DISTAL ESOPHAGUS, BIOPSY:  Reactive squamous mucosa  Scant benign gastric mucosa with no diagnostic abnormality  Negative for eosinophils, dysplasia and carcinoma  D. PROXIMAL ESOPHAGUS, BIOPSY:  Reactive squamous mucosa with a single eosinophil (1 EOS/HPF)  Benign oxyntocardia type gastric mucosa compatible with inlet patch  Negative for dysplasia and carcinoma   He  reports that he has never smoked. He has never used smokeless tobacco. He reports that he does not drink alcohol and does not use drugs.  RELEVANT LABS AND IMAGING:  Current Medications:   Current Outpatient Medications (Endocrine & Metabolic):    clomiPHENE  (CLOMID ) 50 MG tablet, Take 0.5 tablets (25 mg total) by mouth daily.   levothyroxine (SYNTHROID) 137 MCG tablet, Take 274 mcg by mouth daily before breakfast.   Current Outpatient Medications (Respiratory):    albuterol  (PROVENTIL ) (2.5 MG/3ML) 0.083% nebulizer solution, Take 3 mLs (2.5 mg total) by nebulization every 6 (six) hours as needed for wheezing or shortness of breath.   albuterol  (VENTOLIN  HFA) 108 (90 Base) MCG/ACT inhaler, Inhale 2 puffs into the lungs every 6 (six) hours as needed for wheezing or shortness of breath.   fluticasone  (FLONASE ) 50 MCG/ACT nasal spray, Place 1 spray into both nostrils 2 (two) times daily.   fluticasone  (FLOVENT   HFA) 220 MCG/ACT inhaler, 2 puffs into mouth morning and night, swallow  medication, do not eat or drink for 30 mins afterwards   loratadine (CLARITIN) 10 MG tablet, Take 10 mg by mouth daily.   montelukast (SINGULAIR) 10 MG tablet, Take 1 tablet by mouth at bedtime.  Current Outpatient Medications (Analgesics):    meloxicam (MOBIC) 15 MG tablet, Take 15 mg by mouth daily.   Current Outpatient Medications (Other):    Ascorbic Acid (VITAMIN C) 1000 MG tablet, Take 1,000 mg by mouth daily.   b complex vitamins capsule, Take 1 capsule by mouth daily.   Baclofen  5 MG TABS, Take 1 tablet (5 mg total) by mouth 2 (two) times daily.   famotidine  (PEPCID ) 20 MG tablet, Take 1 tablet by mouth twice daily   Multiple Vitamin (MULTIVITAMIN) capsule, Take 1 capsule by mouth daily.   pantoprazole  (PROTONIX ) 40 MG tablet, Take 1 tablet (40 mg total) by mouth 2 (two) times daily before a meal.   sertraline (ZOLOFT) 50 MG tablet, Take 50 mg by mouth daily.   VITAMIN D, CHOLECALCIFEROL, PO, Take 5,000 Units by mouth daily.   Medical History:  Past Medical History:  Diagnosis Date   Arthritis    Asthma    Depression    GERD (gastroesophageal reflux disease)    Obesity    Seasonal allergies    Sleep apnea    Thyroid cancer (HCC)    Thyroid disease    Allergies: No Known Allergies   Surgical History:  He  has a past surgical history that includes Thyroidectomy; Colonoscopy with propofol  (N/A, 12/24/2022); Esophagogastroduodenoscopy (egd) with propofol  (N/A, 12/24/2022); biopsy (12/24/2022); polypectomy (12/24/2022); Esophagogastroduodenoscopy (egd) with propofol  (N/A, 04/08/2023); biopsy (04/08/2023); Esophageal manometry (N/A, 01/01/2024); and 24 hour ph study (N/A, 01/01/2024). Family History:  His family history includes Depression in his sister; Schizophrenia in his sister.  PHYSICAL EXAM: General: Well appearing Resp: No increased WOB. CTAB CV: Regular rate Abd: Non-tender, non-distended Rectal: Declined    Daina Drum, MD 3:48 PM   I spent 35 minutes of time,  including independent review of results as outlined above, communicating results with the patient directly, face-to-face time with the patient, coordinating care, ordering studies and medications as appropriate, and documentation.

## 2024-03-12 ENCOUNTER — Other Ambulatory Visit: Payer: Self-pay

## 2024-03-12 ENCOUNTER — Encounter: Payer: Self-pay | Admitting: Internal Medicine

## 2024-03-12 MED ORDER — NONFORMULARY OR COMPOUNDED ITEM: 2 refills | Status: AC

## 2024-03-12 MED ORDER — NONFORMULARY OR COMPOUNDED ITEM: 2 refills | Status: DC

## 2024-03-12 NOTE — Telephone Encounter (Signed)
 Refilled medication for anal fissure Sitz baths, high-fiber diet, add on benefiber.  Long discussion about preventing constipation discussed in detail for prevention.  Diltiazem2%/lidocaine  5% 3 x daily for 2 months on rectum #30 gram with refills Follow up 2-3 months for secondary evaluation and possible surgical referral for repair if not improved.

## 2024-05-10 ENCOUNTER — Encounter: Payer: Self-pay | Admitting: Internal Medicine

## 2024-05-12 MED ORDER — CITALOPRAM HYDROBROMIDE 20 MG PO TABS: 20.0000 mg | ORAL_TABLET | Freq: Every day | ORAL | 2 refills | Status: DC

## 2024-05-22 ENCOUNTER — Encounter: Payer: Self-pay | Admitting: Internal Medicine

## 2024-05-22 ENCOUNTER — Ambulatory Visit: Admitting: Internal Medicine

## 2024-05-22 VITALS — BP 124/72 | HR 82 | Ht 69.0 in | Wt 349.0 lb

## 2024-05-22 DIAGNOSIS — K625 Hemorrhage of anus and rectum: Secondary | ICD-10-CM

## 2024-05-22 DIAGNOSIS — K602 Anal fissure, unspecified: Secondary | ICD-10-CM

## 2024-05-22 DIAGNOSIS — K6289 Other specified diseases of anus and rectum: Secondary | ICD-10-CM

## 2024-05-22 DIAGNOSIS — K648 Other hemorrhoids: Secondary | ICD-10-CM | POA: Diagnosis not present

## 2024-05-22 DIAGNOSIS — R1310 Dysphagia, unspecified: Secondary | ICD-10-CM

## 2024-05-22 DIAGNOSIS — K581 Irritable bowel syndrome with constipation: Secondary | ICD-10-CM

## 2024-05-22 DIAGNOSIS — K2100 Gastro-esophageal reflux disease with esophagitis, without bleeding: Secondary | ICD-10-CM

## 2024-05-22 DIAGNOSIS — K219 Gastro-esophageal reflux disease without esophagitis: Secondary | ICD-10-CM | POA: Diagnosis not present

## 2024-05-22 DIAGNOSIS — K5900 Constipation, unspecified: Secondary | ICD-10-CM

## 2024-05-22 DIAGNOSIS — Z903 Acquired absence of stomach [part of]: Secondary | ICD-10-CM

## 2024-05-22 DIAGNOSIS — K200 Eosinophilic esophagitis: Secondary | ICD-10-CM

## 2024-05-22 DIAGNOSIS — K649 Unspecified hemorrhoids: Secondary | ICD-10-CM

## 2024-05-22 MED ORDER — FLUOXETINE HCL 10 MG PO CAPS: 10.0000 mg | ORAL_CAPSULE | Freq: Every day | ORAL | 4 refills | Status: DC

## 2024-05-22 MED ORDER — AMBULATORY NON FORMULARY MEDICATION: 1 refills | Status: AC

## 2024-05-22 MED ORDER — LINACLOTIDE 72 MCG PO CAPS: 72.0000 ug | ORAL_CAPSULE | Freq: Every day | ORAL | 6 refills | Status: AC

## 2024-05-22 NOTE — Progress Notes (Addendum)
 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 Famotidine  has helped with his symptoms. He had esophageal manometry and pH impedance study done on PPI therapy. His esophageal manometry test showed normal motility with intact smooth muscle augmentation, and his pH impedance test showed reflux hypersensitivity. His residual reflux symptoms actually responded very well to nortriptyline , but unfortunately he was not able to tolerate being on this medication due to issues with constipation and developing some sleepiness. He has not responded to citalopram  thus far but has only been on this for a few days so will see if being on this medication for a bit longer will also help with his residual reflux symptoms. - Continue pantoprazole  and famotidine  - Cont citalopram  for now. If not effective, then can switch to fluoxetine  10 mg every day. If fluoxetine  does not work, then will try low dose nortriptyline  10 mg at bedtime along with a prescription constipation medication to see if this prevents potential side effects - RTC 2 months with an APP  IBS-C Worsened since being on nortriptyline  - Will start on Linzess  72 mcg every day. Will give some samples.  Rectal bleeding Rectal pain Anal fissure Hemorrhoids Developed rectal bleeding and pain while being more constipated. Rectal exam today revealed a posterior anal fissure as well as several irritated internal hemorrhoids. He has been using diltiazem/lidocaine  ointment for the last few weeks without good effect so will have him switch to nifedipine ointment to see if this works better. I do think that keeping his constipation under better control will also help with healing of his anal fissure. -  Nifedipine 0.2% with Lidocaine  5% - Using your index finger, apply a small amount of medication inside the rectum up to your first knuckle/joint three times daily x 8 weeks  Thyroid carcinoma (HCC) S/p thyroidectomy and RAI ablation  History of bariatric surgery History of gastric sleeve  Obstructive sleep apnea (adult) (pediatric) On CPAP  BMI 50.0-59.9, adult (HCC) Attempting to lose weight  Patient Care Team: George, Sionne A, MD as PCP - General (Family Medicine)  HISTORY OF PRESENT ILLNESS: 33 y.o. 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 The citalopram  has caused him to be somewhat nauseous. The nortriptyline  made him sleepy and constipated but it was helping with his reflux. He has been losing weight loss by cutting out a lot of foods but he does feel like his weight loss has plateaued. He was never started on Zepbound because he was worried about the side effect of constipation. Endorses rectal bleeding and pain as a result of his recent constipation issues.  His bowel movements have not fully normalized as of yet. He does still strain when passing BMs.  Wt Readings from Last 3 Encounters:  05/22/24 (!) 349 lb (158.3 kg)  02/26/24 (!) 368 lb 3.2 oz (167 kg)  08/26/23 (!) 370 lb (167.8 kg)   10/08/2022 labs reviewed showed WBC 13, Hgb 13.5, platelets 312, MCV 90, B12 696, iron 59, percent saturation 21, TIBC 287, ferritin 79, folate 220 CRP  was elevated at 3.93 Vitamin D 33, potassium 3.4, normal magnesium, normal kidney otherwise. Alk phos 112, AST 20, ALT 21, total bili 0.4 TSH 2.92  Labs 02/2024: TSH elevated at 9.84. FT4 nml. BMP nml. HbA1C 5.8%.   Labs 03/2024: TSH nml at 1.56 and FT4 nml at 0.97. BMP  unremarkable. HbA1C 5.7%.  Barium swallow 11/23/22: IMPRESSION: Small sliding-type hiatal hernia. No mass, stricture, significant dysmotility or gastroesophageal reflux  EGD 12/24/22: - Esophageal furrows. Biopsied. - Gastroesophageal junction polyp( s) were found. Resected and retrieved. - One gastric polyp. Resected and retrieved. - A sleeve gastrectomy was found, characterized by healthy appearing mucosa. - Erythematous mucosa in the antrum. Biopsied. - Normal examined duodenum. Path: A. STOMACH, BIOPSY:  Reactive gastropathy and minimal chronic gastritis with lymphoid  aggregates  Focal intestinal metaplasia  Negative for H. pylori, dysplasia and carcinoma  B. ESOPHAGUS, DISTAL, BIOPSY:  Eosinophil-rich esophagitis (greater than 50 eos/hpf) (see comment)  Scant cardia type gastric mucosa with no diagnostic abnormality  Negative for intestinal metaplasia, dysplasia and carcinoma  C. ESOPHAGUS, PROXIMAL, BIOPSY:  Eosinophil-rich esophagitis ((greater than 40 eos/hpf) (see comment)  Scant cardia type gastric mucosa with no diagnostic abnormality and  suggestive of inlet patch  Negative for intestinal metaplasia, dysplasia and carcinoma  D. GE JUNCTION, POLYPECTOMY: Compatible with hyperplastic polyp Acute erosive esophagitis Chronic active gastritis Focal pancreatic metaplasia Negative for intestinal metaplasia, dysplasia and carcinoma  Comment: Both the distal and proximal esophageal biopsies exhibit an eosinophil  rich esophagitis the differential diagnosis of which would include true  eosinophilic esophagitis (favored) and severe reflux esophagitis.  Clinical and endoscopic correlation is recommended.   Colonoscopy 12/24/22: - The examined portion of the ileum was normal. - Localized inflammation was found at the appendiceal orifice. Biopsied. - One 2 mm polyp in the cecum, removed with a cold biopsy forceps. Resected and retrieved. - One 3 mm polyp in the rectum, removed with a  cold snare. Resected and retrieved. - Non- bleeding internal hemorrhoids. - Anal fissure found on perianal exam. Path: E. APPENDICEAL ORIFICE, BIOPSY:  Focal active colitis. The appendiceal orifice biopsy shows a focal active colitis the histologic changes of which have been most commonly associated with a resolving acute self-limited colitis; however, certain drugs particularly NSAIDs may elicit similar changes.  Additionally given the location no underlying acute appendicitis might be considered.  Clinical and endoscopic correlation is recommended.  F. CECUM, POLYPECTOMY:  Benign colonic mucosa with prominent lymphoid aggregate  Negative for dysplasia and carcinoma  G. RECTUM, POLYPECTOMY:  Hyperplastic polyp  Negative for dysplasia and carcinoma   EGD 7/22/224: - Esophageal mucosal changes secondary to eosinophilic esophagitis. - A sleeve gastrectomy was found, characterized by healthy appearing mucosa. - Normal stomach. - Normal examined duodenum. - Biopsies were taken with a cold forceps for evaluation of eosinophilic esophagitis. - Biopsies were obtained on the greater curvature of the gastric body, on the lesser curvature of the gastric body, at the incisura, on the greater curvature of the gastric antrum and on the lesser curvature of the gastric antrum. Path: A. STOMACH, ANTRUM, BIOPSY:  Reactive gastropathy and mild chronic gastritis with lymphoid aggregate  showing focal intestinal metaplasia  Negative for H. pylori, dysplasia and carcinoma  B. STOMACH, BODY, BIOPSY:  Minimal chronic gastritis with reactive epithelial changes  Negative for H. pylori, intestinal metaplasia, dysplasia and carcinoma  C. DISTAL ESOPHAGUS, BIOPSY:  Reactive squamous mucosa  Scant benign gastric mucosa with no diagnostic abnormality  Negative for eosinophils, dysplasia and  carcinoma  D. PROXIMAL ESOPHAGUS, BIOPSY:  Reactive squamous mucosa with a single eosinophil (1 EOS/HPF)  Benign oxyntocardia  type gastric mucosa compatible with inlet patch  Negative for dysplasia and carcinoma   He  reports that he has never smoked. He has never used smokeless tobacco. He reports that he does not drink alcohol and does not use drugs.  RELEVANT LABS AND IMAGING:  Current Medications:   Current Outpatient Medications (Endocrine & Metabolic):    levothyroxine (SYNTHROID) 137 MCG tablet, Take 274 mcg by mouth daily before breakfast.   Current Outpatient Medications (Respiratory):    albuterol  (PROVENTIL ) (2.5 MG/3ML) 0.083% nebulizer solution, Take 3 mLs (2.5 mg total) by nebulization every 6 (six) hours as needed for wheezing or shortness of breath.   albuterol  (VENTOLIN  HFA) 108 (90 Base) MCG/ACT inhaler, Inhale 2 puffs into the lungs every 6 (six) hours as needed for wheezing or shortness of breath.   fluticasone  (FLONASE ) 50 MCG/ACT nasal spray, Place 1 spray into both nostrils 2 (two) times daily.   fluticasone  (FLOVENT  HFA) 220 MCG/ACT inhaler, 2 puffs into mouth morning and night, swallow medication, do not eat or drink for 30 mins afterwards   loratadine (CLARITIN) 10 MG tablet, Take 10 mg by mouth daily.   montelukast (SINGULAIR) 10 MG tablet, Take 1 tablet by mouth at bedtime.  Current Outpatient Medications (Analgesics):    meloxicam (MOBIC) 15 MG tablet, Take 15 mg by mouth daily.   Current Outpatient Medications (Other):    Ascorbic Acid (VITAMIN C) 1000 MG tablet, Take 1,000 mg by mouth daily.   b complex vitamins capsule, Take 1 capsule by mouth daily.   citalopram  (CELEXA ) 20 MG tablet, Take 1 tablet (20 mg total) by mouth daily.   famotidine  (PEPCID ) 20 MG tablet, Take 1 tablet by mouth twice daily   Multiple Vitamin (MULTIVITAMIN) capsule, Take 1 capsule by mouth daily.   NONFORMULARY OR COMPOUNDED ITEM, Diltiazem 2 % lidocaine  5% ointment using index finger apply a small amout of medicationinside the rectum up to your first knuckle 3 times daily for 8 weeks   pantoprazole   (PROTONIX ) 40 MG tablet, Take 1 tablet (40 mg total) by mouth 2 (two) times daily before a meal.   VITAMIN D, CHOLECALCIFEROL, PO, Take 5,000 Units by mouth daily.   Medical History:  Past Medical History:  Diagnosis Date   Arthritis    Asthma    Depression    GERD (gastroesophageal reflux disease)    Obesity    Seasonal allergies    Sleep apnea    Thyroid cancer (HCC)    Thyroid disease    Allergies: No Known Allergies   Surgical History:  He  has a past surgical history that includes Thyroidectomy; Colonoscopy with propofol  (N/A, 12/24/2022); Esophagogastroduodenoscopy (egd) with propofol  (N/A, 12/24/2022); biopsy (12/24/2022); polypectomy (12/24/2022); Esophagogastroduodenoscopy (egd) with propofol  (N/A, 04/08/2023); biopsy (04/08/2023); Esophageal manometry (N/A, 01/01/2024); and 24 hour ph study (N/A, 01/01/2024). Family History:  His family history includes Depression in his sister; Schizophrenia in his sister.  PHYSICAL EXAM: General: Well appearing Resp: No increased WOB. CTAB CV: Regular rate Abd: Non-tender, non-distended Rectal: Anoscopy was performed that showed a posterior anal fissure as well as irritated internal hemorrhoids  Rosario JAYSON Kidney, MD 2:33 PM  I spent 32 minutes of time, including independent review of results as outlined above, communicating results with the patient directly, face-to-face time with the patient, coordinating care, ordering studies and medications as appropriate, and documentation.

## 2024-05-22 NOTE — Patient Instructions (Addendum)
 _______________________________________________________  If your blood pressure at your visit was 140/90 or greater, please contact your primary care physician to follow up on this.  _______________________________________________________  If you are age 33 or older, your body mass index should be between 23-30. Your Body mass index is 51.54 kg/m. If this is out of the aforementioned range listed, please consider follow up with your Primary Care Provider.  If you are age 41 or younger, your body mass index should be between 19-25. Your Body mass index is 51.54 kg/m. If this is out of the aformentioned range listed, please consider follow up with your Primary Care Provider.   ________________________________________________________  The [facility] providers would like to encourage you to use MYCHART to communicate with providers for non-urgent requests or questions.  Due to long hold times on the telephone, sending your provider a message by [facility] may be a faster and more efficient way to get a response.  Please allow 48 business hours for a response.  Please remember that this is for non-urgent requests.  _______________________________________________________  [facility] is using a team-based approach to care.  Your team is made up of your doctor and two to three APPS. Our APPS (Nurse Practitioners and Physician Assistants) work with your physician to ensure care continuity for you. They are fully qualified to address your health concerns and develop a treatment plan. They communicate directly with your gastroenterologist to care for you. Seeing the Advanced Practice Practitioners on your physician's team can help you by facilitating care more promptly, often allowing for earlier appointments, access to diagnostic testing, procedures, and other specialty referrals.   We have sent the following medications to your pharmacy for you to pick up at your convenience: Prozac  10mg  daily.  Stop Celexa  Linzess  72mcg daily and samples have been given Exp 11-2025 Lot [id number]  Appointment has been scheduled for  [facility] information is below: Address: [address], [place], [place] [address]  [phone]  *Please DO NOT go directly from our office to pick up this medication! Give the pharmacy 1 day to process the prescription as this is compounded and takes time to make.  It was a pleasure to see you today!  Thank you for trusting me with your gastrointestinal care!

## 2024-05-25 ENCOUNTER — Telehealth: Payer: Self-pay

## 2024-05-25 ENCOUNTER — Other Ambulatory Visit (HOSPITAL_COMMUNITY): Payer: Self-pay

## 2024-05-25 NOTE — Telephone Encounter (Signed)
 Pharmacy Patient Advocate Encounter   Received notification from CoverMyMeds that prior authorization for Linzess  capsules is required/requested.   Insurance verification completed.   The patient is insured through [facility] .   Prior Authorization form/request asks a question that requires your assistance. Please see the question below and advise accordingly. The PA will not be submitted until the necessary information is received.

## 2024-05-26 NOTE — Telephone Encounter (Signed)
 Pharmacy Patient Advocate Encounter  Received notification from [employer] that Prior Authorization for Linzess  capsules has been DENIED.  Full denial letter will be uploaded to the media tab. See denial reason below.  Per your health plan's criteria, this drug is covered if you meet the following:  The plan allows us  to cover a drug only when it is used for a medically accepted indication. A medically accepted indication means the use is approved by the Food and Drug Administration (FDA) or one of the following medical resources:  (1) Two articles from major peer-reviewed medical journals (2) The [facility] Formulary Service Drug Information (3) DRUGDEX System by [employer]  This denial is based on the following clinical guideline(s):  Coverage of Off-Label Non-FDA Approved Indications - [id number]  PA #/Case ID/Reference #: [id number]

## 2024-05-26 NOTE — Telephone Encounter (Signed)
 Please see note below.

## 2024-05-26 NOTE — Telephone Encounter (Signed)
 Will need to have most recent office visit updated so that the diagnosis is documented. Insurance will not accept otherwise

## 2024-05-26 NOTE — Telephone Encounter (Signed)
 Please see note below and advise

## 2024-05-26 NOTE — Telephone Encounter (Signed)
 Please see notes below.

## 2024-05-27 ENCOUNTER — Telehealth: Payer: Self-pay

## 2024-05-27 NOTE — Telephone Encounter (Signed)
 Pharmacy Patient Advocate Encounter   Received notification from Pt Calls Messages that prior authorization for Linzess  capsules is required/requested.   Insurance verification completed.   The patient is insured through [facility] .   Prior Authorization for Linzess  capsules has been APPROVED from 05-27-2024 to 05-27-2025   PA #/Case ID/Reference #: [id number]

## 2024-05-27 NOTE — Telephone Encounter (Signed)
 Pt made aware. Pt verbalized understanding with all questions answered.

## 2024-06-09 ENCOUNTER — Other Ambulatory Visit: Payer: Self-pay | Admitting: Internal Medicine

## 2024-06-09 NOTE — Telephone Encounter (Signed)
 I spoke to Corning and asked if he was still taking the citalopram  or if he switched to the fluoxetine .  He advised that he is still taking the citalopram  and on 9/15 his PCP (PA Damien Arlean Ryder) increased his dose to 40 mg.  I reviewed his OV note for 9/15 and confirmed the increase.  I told him that I will adjust the refill for the new dose.

## 2024-06-09 NOTE — Telephone Encounter (Signed)
 I left a detailed message stating that I couldn't refill for the 40 mg since Dr. Federico is out on maternity leave but I can refill for the 20 mg.  I sent in a 90 day supply which should cover him until he sees his PCP again to discuss.  Then they can refill for the 40 mg.

## 2024-07-27 ENCOUNTER — Telehealth: Payer: Self-pay | Admitting: Physician Assistant

## 2024-07-27 NOTE — Telephone Encounter (Signed)
 Good afternoon Tony Pham  The following patient has an appointment with you tomorrow for an anal fissure update. He wants to know if it can be done as a virtual appointment. Please review and advise of scheduling.   Thank you.

## 2024-07-28 ENCOUNTER — Telehealth: Payer: Self-pay | Admitting: Physician Assistant

## 2024-07-28 ENCOUNTER — Ambulatory Visit: Admitting: Physician Assistant

## 2024-07-28 DIAGNOSIS — R1319 Other dysphagia: Secondary | ICD-10-CM

## 2024-07-28 DIAGNOSIS — K200 Eosinophilic esophagitis: Secondary | ICD-10-CM | POA: Diagnosis not present

## 2024-07-28 DIAGNOSIS — K2100 Gastro-esophageal reflux disease with esophagitis, without bleeding: Secondary | ICD-10-CM

## 2024-07-28 MED ORDER — NORTRIPTYLINE HCL 10 MG PO CAPS: 10.0000 mg | ORAL_CAPSULE | Freq: Every day | ORAL | 0 refills | Status: DC

## 2024-07-28 NOTE — Telephone Encounter (Signed)
 Mr. Tony Pham, Tony Pham are scheduled for a virtual visit.  Just as we do with appointments in the office, we must obtain your consent to participate.  Your consent will be active for this visit and any virtual visit you may have with one of our providers in the next 365 days.    If you have a MyChart account, I can also send a copy of this consent to you electronically.  All virtual visits are billed to your insurance company just like a traditional visit in the office.  As this is a virtual visit, video technology does not allow for your provider to perform a traditional examination.  This may limit your provider's ability to fully assess your condition.  If your provider identifies any concerns that need to be evaluated in person or the need to arrange testing such as labs, EKG, etc, we will make arrangements to do so.    Although advances in technology are sophisticated, we cannot ensure that it will always work on either your end or our end.  If the connection with a video visit is poor, we may have to switch to a telephone visit.  With either a video or telephone visit, we are not always able to ensure that we have a secure connection.   I need to obtain your verbal consent now.    Are you willing to proceed with your visit today?  Response: Yes  Alan JONELLE Coombs, PA-C 07/28/2024  3:04 PM

## 2024-07-28 NOTE — Progress Notes (Signed)
 07/28/2024 Tony Pham [id number] 1991-07-05  Referring provider: Zachary Idelia LABOR, MD Primary GI doctor: Dr. Federico  I verified that I am speaking with the correct person using two identifiers.  I discussed the limitations, risks, security and privacy concerns of performing an evaluation and management service by telephone and the availability of in person appointments.  I also discussed with the patient that there may be a charge for the services provided today billed to their insurance including co-pays and deductible .  The patient expressed understanding and agreed to proceed.   ASSESSMENT AND PLAN:  Eosinophilic esophagitis, focal intestinal metaplasia negative H. pylori 11/23/2022 barium swallow small sliding-type hiatal hernia no mass stricture or significant dysmotility or GERD 12/24/2022 EGD showed EOE negative H. Pylori Started on pantoprazole  twice daily 04/08/2023 EGD esophageal mucosal changes secondary to eosinophilic esophagitis, sleeve gastrectomy healthy-appearing mucosa normal stomach normal duodenum, repeat esophageal biopsies showed EOE responded well to PPI twice daily, recall 3 years for gastric intestinal metaplasia 01/03/2024 esophageal manometry  normal esophageal motility 01/03/2024 pH study normal total acid exposure time 1.3%, normal DeMeester score 7.9 SAP regurgitation does correlate with reflux events cough did not correlate, number of reflux events elevated, diagnosis reflux hypersensitivity - continue PPI BID, no dysphagia - repeat EGD 2027 for GIM  Reflux hypersensitivity/regurgitation Nortriptyline  caused fatigue at 25 mg, switched to citalopram  20 mg daily was not helpful He would like to try nortiptyline 10 mg at night trial - will do taper off celexa , sent taper to patient via mychart - will start on low dose nortriptyline  10 mg, may need linzess  - if this is not helpful will plan on trying fluoxetine  20 mg versus cymbalta - follow up 2-3  months  Rectal bleeding with history of fissure/hemorrhoids 12/24/2022 colonoscopy at [facility] good bowel prep normal TI 2 mm polyp cecum 3 mm polyp rectum nonbleeding internal hemorrhoids localized inflammation appendiceal orifice Hyperplastic polyps, appendiceal orifice active colitis mass, associated with self-limited colitis such as drug medications -Has responded well to nifedimine and stools softener and this has helped and he is not longer having issues - consider adding back on linzess  72 mcg  Thyroid carcinoma (HCC) S/p thyroidectomy and RAI ablation  History of bariatric surgery History of gastric sleeve ? Benefit from bypass?  Obstructive sleep apnea (adult) (pediatric) On CPAP  BMI 50.0-59.9, adult [facility]) Patient will be scheduled at the hospital for their procedure due to ASA IV criteria: BMI over 50   Patient Care Team: George, Sionne A, MD as PCP - General (Family Medicine)  HISTORY OF PRESENT ILLNESS: 33 y.o. male with a past medical history of papillary thyroid cancer status post RAI ablation and thyroidectomy 2013, OSA, status post gastric sleeve 2018 and others listed below presents for evaluation of rectal bleeding and dysphagia.   Last see my Dr. Federico 05/2024.  Discussed the use of AI scribe software for clinical note transcription with the patient, who gave verbal consent to proceed.  History of Present Illness   Tony Pham is a 33 year old male with eosinophilic esophagitis and reflux hypersensitivity who presents for a follow-up visit.  He was last seen on May 22, 2024, for eosinophilic esophagitis and reflux hypersensitivity/regurgitation. Initially, he was placed on nortriptyline  25 mg at night, which significantly improved his hypersensitivity/regurgitation symptoms but caused excessive tiredness. Consequently, he was switched to citalopram  (Celexa ), which has not been effective. He wants to retry nortriptyline  at a lower dose.  He continues  to  take a proton pump inhibitor (PPI) twice daily for eosinophilic esophagitis. No heartburn, dysphagia, or other symptoms are reported. He has a history of gastrointestinal metaplasia.  He was also treated with nifedipine and started on a stool softener, which has significantly improved his bowel movements. No further bleeding or rectal pain is reported. He is not currently taking Linzess . No melena, hematochezia, constipation, or blood in the stool is reported. Overall, his symptoms are well controlled.      He  reports that he has never smoked. He has never used smokeless tobacco. He reports that he does not drink alcohol and does not use drugs. SH: has 78-52 year old son  RELEVANT LABS AND IMAGING:  Current Medications:   Current Outpatient Medications (Endocrine & Metabolic):    levothyroxine (SYNTHROID) 137 MCG tablet, Take 274 mcg by mouth daily before breakfast.   Current Outpatient Medications (Respiratory):    albuterol  (PROVENTIL ) (2.5 MG/3ML) 0.083% nebulizer solution, Take 3 mLs (2.5 mg total) by nebulization every 6 (six) hours as needed for wheezing or shortness of breath.   albuterol  (VENTOLIN  HFA) 108 (90 Base) MCG/ACT inhaler, Inhale 2 puffs into the lungs every 6 (six) hours as needed for wheezing or shortness of breath.   fluticasone  (FLONASE ) 50 MCG/ACT nasal spray, Place 1 spray into both nostrils 2 (two) times daily.   fluticasone  (FLOVENT  HFA) 220 MCG/ACT inhaler, 2 puffs into mouth morning and night, swallow medication, do not eat or drink for 30 mins afterwards   loratadine (CLARITIN) 10 MG tablet, Take 10 mg by mouth daily.   montelukast (SINGULAIR) 10 MG tablet, Take 1 tablet by mouth at bedtime.  Current Outpatient Medications (Analgesics):    meloxicam (MOBIC) 15 MG tablet, Take 15 mg by mouth daily.   Current Outpatient Medications (Other):    AMBULATORY NON FORMULARY MEDICATION, Medication Name: Medication Name: Nifedipine 0.2% gel/Lidocaine  5% Apply a small  amount to the inside and external rectum area 3 times a day for 8 weeks.   Ascorbic Acid (VITAMIN C) 1000 MG tablet, Take 1,000 mg by mouth daily.   b complex vitamins capsule, Take 1 capsule by mouth daily.   citalopram  (CELEXA ) 20 MG tablet, Take 1 tablet (20 mg total) by mouth daily.   famotidine  (PEPCID ) 20 MG tablet, Take 1 tablet by mouth twice daily   FLUoxetine  (PROZAC ) 10 MG capsule, Take 1 capsule (10 mg total) by mouth daily.   linaclotide  (LINZESS ) 72 MCG capsule, Take 1 capsule (72 mcg total) by mouth daily before breakfast.   Multiple Vitamin (MULTIVITAMIN) capsule, Take 1 capsule by mouth daily.   NONFORMULARY OR COMPOUNDED ITEM, Diltiazem 2 % lidocaine  5% ointment using index finger apply a small amout of medicationinside the rectum up to your first knuckle 3 times daily for 8 weeks   pantoprazole  (PROTONIX ) 40 MG tablet, Take 1 tablet (40 mg total) by mouth 2 (two) times daily before a meal.   VITAMIN D, CHOLECALCIFEROL, PO, Take 5,000 Units by mouth daily.   Medical History:  Past Medical History:  Diagnosis Date   Arthritis    Asthma    Depression    GERD (gastroesophageal reflux disease)    Obesity    Seasonal allergies    Sleep apnea    Thyroid cancer (HCC)    Thyroid disease    Allergies: No Known Allergies   Surgical History:  He  has a past surgical history that includes Thyroidectomy; Colonoscopy with propofol  (N/A, 12/24/2022); Esophagogastroduodenoscopy (egd) with propofol  (N/A, 12/24/2022); biopsy (  12/24/2022); polypectomy (12/24/2022); Esophagogastroduodenoscopy (egd) with propofol  (N/A, 04/08/2023); biopsy (04/08/2023); Esophageal manometry (N/A, 01/01/2024); and 24 hour ph study (N/A, 01/01/2024). Family History:  His family history includes Depression in his sister; Schizophrenia in his sister. Dad with desmoid tumor, s/p small bowel resection. No colon cancer or other GI cancer.   REVIEW OF SYSTEMS  : All other systems reviewed and negative except where noted in  the History of Present Illness.  PHYSICAL EXAM: There were no vitals taken for this visit. General Appearance:Well sounding, in no apparent distress.  ENT/Mouth: No hoarseness, No cough for duration of visit.  Respiratory: completing full sentences without distress, without audible wheeze Neuro: Awake and oriented X 3,  Psych:  Insight and Judgment appropriate.    Alan JONELLE Coombs, PA-C 8:07 AM

## 2024-08-25 ENCOUNTER — Other Ambulatory Visit: Payer: Self-pay | Admitting: Physician Assistant
